# Patient Record
Sex: Male | Born: 1955
Health system: Southern US, Community
[De-identification: ages and names within clinical notes are randomized; demographics above are authoritative.]

## PROBLEM LIST (undated history)

## (undated) DIAGNOSIS — M109 Gout, unspecified: Secondary | ICD-10-CM

## (undated) DIAGNOSIS — I10 Essential (primary) hypertension: Secondary | ICD-10-CM

## (undated) DIAGNOSIS — L299 Pruritus, unspecified: Secondary | ICD-10-CM

## (undated) DIAGNOSIS — E119 Type 2 diabetes mellitus without complications: Secondary | ICD-10-CM

## (undated) DIAGNOSIS — L219 Seborrheic dermatitis, unspecified: Secondary | ICD-10-CM

## (undated) DIAGNOSIS — M7711 Lateral epicondylitis, right elbow: Secondary | ICD-10-CM

## (undated) HISTORY — PX: CATARACT EXTRACTION, BILATERAL: SHX1313

## (undated) HISTORY — PX: HIP SURGERY: SHX245

---

## 1998-06-26 ENCOUNTER — Ambulatory Visit (HOSPITAL_BASED_OUTPATIENT_CLINIC_OR_DEPARTMENT_OTHER): Admission: RE | Admit: 1998-06-26 | Discharge: 1998-06-26 | Payer: Self-pay | Admitting: Orthopedic Surgery

## 2003-06-12 ENCOUNTER — Ambulatory Visit (HOSPITAL_BASED_OUTPATIENT_CLINIC_OR_DEPARTMENT_OTHER): Admission: RE | Admit: 2003-06-12 | Discharge: 2003-06-12 | Payer: Self-pay | Admitting: Otolaryngology

## 2003-11-04 ENCOUNTER — Ambulatory Visit (HOSPITAL_COMMUNITY): Admission: RE | Admit: 2003-11-04 | Discharge: 2003-11-04 | Payer: Self-pay | Admitting: Neurology

## 2004-01-11 ENCOUNTER — Other Ambulatory Visit: Payer: Self-pay

## 2004-11-22 ENCOUNTER — Ambulatory Visit (HOSPITAL_COMMUNITY): Admission: RE | Admit: 2004-11-22 | Discharge: 2004-11-22 | Payer: Self-pay | Admitting: Cardiology

## 2004-12-27 ENCOUNTER — Inpatient Hospital Stay (HOSPITAL_COMMUNITY): Admission: RE | Admit: 2004-12-27 | Discharge: 2004-12-31 | Payer: Self-pay | Admitting: Orthopedic Surgery

## 2005-09-22 ENCOUNTER — Ambulatory Visit (HOSPITAL_COMMUNITY): Admission: RE | Admit: 2005-09-22 | Discharge: 2005-09-22 | Payer: Self-pay | Admitting: Gastroenterology

## 2009-06-13 ENCOUNTER — Emergency Department: Payer: Self-pay | Admitting: Internal Medicine

## 2009-06-24 ENCOUNTER — Emergency Department: Payer: Self-pay | Admitting: Emergency Medicine

## 2011-01-08 ENCOUNTER — Encounter: Payer: Self-pay | Admitting: Cardiology

## 2011-11-11 ENCOUNTER — Other Ambulatory Visit: Payer: Self-pay | Admitting: Cardiology

## 2011-11-27 ENCOUNTER — Other Ambulatory Visit: Payer: Self-pay | Admitting: Cardiology

## 2011-12-07 ENCOUNTER — Ambulatory Visit: Payer: Self-pay | Admitting: Family Medicine

## 2011-12-18 ENCOUNTER — Other Ambulatory Visit: Payer: Self-pay | Admitting: Cardiology

## 2012-03-27 ENCOUNTER — Ambulatory Visit: Payer: Self-pay

## 2012-05-15 ENCOUNTER — Other Ambulatory Visit: Payer: Self-pay | Admitting: Cardiology

## 2012-07-16 ENCOUNTER — Other Ambulatory Visit: Payer: Self-pay | Admitting: Cardiology

## 2013-11-19 DIAGNOSIS — J449 Chronic obstructive pulmonary disease, unspecified: Secondary | ICD-10-CM | POA: Diagnosis not present

## 2013-11-19 DIAGNOSIS — I10 Essential (primary) hypertension: Secondary | ICD-10-CM | POA: Diagnosis not present

## 2013-11-19 DIAGNOSIS — G4733 Obstructive sleep apnea (adult) (pediatric): Secondary | ICD-10-CM | POA: Diagnosis not present

## 2013-11-19 DIAGNOSIS — R5381 Other malaise: Secondary | ICD-10-CM | POA: Diagnosis not present

## 2013-11-19 DIAGNOSIS — R0609 Other forms of dyspnea: Secondary | ICD-10-CM | POA: Diagnosis not present

## 2013-11-19 DIAGNOSIS — E789 Disorder of lipoprotein metabolism, unspecified: Secondary | ICD-10-CM | POA: Diagnosis not present

## 2013-11-19 DIAGNOSIS — R7309 Other abnormal glucose: Secondary | ICD-10-CM | POA: Diagnosis not present

## 2013-11-21 DIAGNOSIS — I10 Essential (primary) hypertension: Secondary | ICD-10-CM | POA: Diagnosis not present

## 2013-11-21 DIAGNOSIS — E7800 Pure hypercholesterolemia, unspecified: Secondary | ICD-10-CM | POA: Diagnosis not present

## 2013-11-21 DIAGNOSIS — R7309 Other abnormal glucose: Secondary | ICD-10-CM | POA: Diagnosis not present

## 2013-12-04 DIAGNOSIS — R0602 Shortness of breath: Secondary | ICD-10-CM | POA: Diagnosis not present

## 2013-12-06 ENCOUNTER — Ambulatory Visit: Payer: Self-pay | Admitting: Specialist

## 2013-12-06 DIAGNOSIS — G4710 Hypersomnia, unspecified: Secondary | ICD-10-CM | POA: Diagnosis not present

## 2013-12-06 DIAGNOSIS — G4733 Obstructive sleep apnea (adult) (pediatric): Secondary | ICD-10-CM | POA: Diagnosis not present

## 2013-12-06 DIAGNOSIS — G4761 Periodic limb movement disorder: Secondary | ICD-10-CM | POA: Diagnosis not present

## 2013-12-06 DIAGNOSIS — E669 Obesity, unspecified: Secondary | ICD-10-CM | POA: Diagnosis not present

## 2013-12-06 DIAGNOSIS — G478 Other sleep disorders: Secondary | ICD-10-CM | POA: Diagnosis not present

## 2013-12-17 DIAGNOSIS — G4733 Obstructive sleep apnea (adult) (pediatric): Secondary | ICD-10-CM | POA: Diagnosis not present

## 2013-12-17 DIAGNOSIS — E669 Obesity, unspecified: Secondary | ICD-10-CM | POA: Diagnosis not present

## 2013-12-17 DIAGNOSIS — R0609 Other forms of dyspnea: Secondary | ICD-10-CM | POA: Diagnosis not present

## 2013-12-17 DIAGNOSIS — J449 Chronic obstructive pulmonary disease, unspecified: Secondary | ICD-10-CM | POA: Diagnosis not present

## 2013-12-24 ENCOUNTER — Encounter: Payer: Self-pay | Admitting: Specialist

## 2013-12-24 DIAGNOSIS — Z5189 Encounter for other specified aftercare: Secondary | ICD-10-CM | POA: Diagnosis not present

## 2013-12-24 DIAGNOSIS — J449 Chronic obstructive pulmonary disease, unspecified: Secondary | ICD-10-CM | POA: Diagnosis not present

## 2013-12-27 ENCOUNTER — Ambulatory Visit: Payer: Self-pay | Admitting: Specialist

## 2013-12-27 DIAGNOSIS — G4733 Obstructive sleep apnea (adult) (pediatric): Secondary | ICD-10-CM | POA: Diagnosis not present

## 2013-12-27 DIAGNOSIS — G4761 Periodic limb movement disorder: Secondary | ICD-10-CM | POA: Diagnosis not present

## 2013-12-27 DIAGNOSIS — G4710 Hypersomnia, unspecified: Secondary | ICD-10-CM | POA: Diagnosis not present

## 2013-12-27 DIAGNOSIS — G4730 Sleep apnea, unspecified: Secondary | ICD-10-CM | POA: Diagnosis not present

## 2013-12-30 DIAGNOSIS — J449 Chronic obstructive pulmonary disease, unspecified: Secondary | ICD-10-CM | POA: Diagnosis not present

## 2013-12-30 DIAGNOSIS — Z5189 Encounter for other specified aftercare: Secondary | ICD-10-CM | POA: Diagnosis not present

## 2014-01-01 DIAGNOSIS — Z5189 Encounter for other specified aftercare: Secondary | ICD-10-CM | POA: Diagnosis not present

## 2014-01-01 DIAGNOSIS — J449 Chronic obstructive pulmonary disease, unspecified: Secondary | ICD-10-CM | POA: Diagnosis not present

## 2014-01-03 DIAGNOSIS — J449 Chronic obstructive pulmonary disease, unspecified: Secondary | ICD-10-CM | POA: Diagnosis not present

## 2014-01-03 DIAGNOSIS — Z5189 Encounter for other specified aftercare: Secondary | ICD-10-CM | POA: Diagnosis not present

## 2014-01-06 DIAGNOSIS — J449 Chronic obstructive pulmonary disease, unspecified: Secondary | ICD-10-CM | POA: Diagnosis not present

## 2014-01-06 DIAGNOSIS — Z5189 Encounter for other specified aftercare: Secondary | ICD-10-CM | POA: Diagnosis not present

## 2014-01-08 DIAGNOSIS — J449 Chronic obstructive pulmonary disease, unspecified: Secondary | ICD-10-CM | POA: Diagnosis not present

## 2014-01-08 DIAGNOSIS — Z5189 Encounter for other specified aftercare: Secondary | ICD-10-CM | POA: Diagnosis not present

## 2014-01-10 DIAGNOSIS — Z5189 Encounter for other specified aftercare: Secondary | ICD-10-CM | POA: Diagnosis not present

## 2014-01-10 DIAGNOSIS — J449 Chronic obstructive pulmonary disease, unspecified: Secondary | ICD-10-CM | POA: Diagnosis not present

## 2014-01-13 DIAGNOSIS — Z5189 Encounter for other specified aftercare: Secondary | ICD-10-CM | POA: Diagnosis not present

## 2014-01-13 DIAGNOSIS — J449 Chronic obstructive pulmonary disease, unspecified: Secondary | ICD-10-CM | POA: Diagnosis not present

## 2014-01-15 DIAGNOSIS — J449 Chronic obstructive pulmonary disease, unspecified: Secondary | ICD-10-CM | POA: Diagnosis not present

## 2014-01-15 DIAGNOSIS — Z5189 Encounter for other specified aftercare: Secondary | ICD-10-CM | POA: Diagnosis not present

## 2014-01-17 DIAGNOSIS — Z5189 Encounter for other specified aftercare: Secondary | ICD-10-CM | POA: Diagnosis not present

## 2014-01-17 DIAGNOSIS — J449 Chronic obstructive pulmonary disease, unspecified: Secondary | ICD-10-CM | POA: Diagnosis not present

## 2014-01-19 ENCOUNTER — Encounter: Payer: Self-pay | Admitting: Specialist

## 2014-01-19 DIAGNOSIS — Z5189 Encounter for other specified aftercare: Secondary | ICD-10-CM | POA: Diagnosis not present

## 2014-01-19 DIAGNOSIS — J449 Chronic obstructive pulmonary disease, unspecified: Secondary | ICD-10-CM | POA: Diagnosis not present

## 2014-02-16 ENCOUNTER — Encounter: Payer: Self-pay | Admitting: Specialist

## 2014-02-16 DIAGNOSIS — J449 Chronic obstructive pulmonary disease, unspecified: Secondary | ICD-10-CM | POA: Diagnosis not present

## 2014-02-16 DIAGNOSIS — Z5189 Encounter for other specified aftercare: Secondary | ICD-10-CM | POA: Diagnosis not present

## 2014-02-17 DIAGNOSIS — Z5189 Encounter for other specified aftercare: Secondary | ICD-10-CM | POA: Diagnosis not present

## 2014-02-17 DIAGNOSIS — J449 Chronic obstructive pulmonary disease, unspecified: Secondary | ICD-10-CM | POA: Diagnosis not present

## 2014-02-19 DIAGNOSIS — Z5189 Encounter for other specified aftercare: Secondary | ICD-10-CM | POA: Diagnosis not present

## 2014-02-19 DIAGNOSIS — J449 Chronic obstructive pulmonary disease, unspecified: Secondary | ICD-10-CM | POA: Diagnosis not present

## 2014-02-21 DIAGNOSIS — J449 Chronic obstructive pulmonary disease, unspecified: Secondary | ICD-10-CM | POA: Diagnosis not present

## 2014-02-21 DIAGNOSIS — Z5189 Encounter for other specified aftercare: Secondary | ICD-10-CM | POA: Diagnosis not present

## 2014-02-25 DIAGNOSIS — J45902 Unspecified asthma with status asthmaticus: Secondary | ICD-10-CM | POA: Diagnosis not present

## 2014-02-25 DIAGNOSIS — J449 Chronic obstructive pulmonary disease, unspecified: Secondary | ICD-10-CM | POA: Diagnosis not present

## 2014-02-25 DIAGNOSIS — J310 Chronic rhinitis: Secondary | ICD-10-CM | POA: Diagnosis not present

## 2014-02-25 DIAGNOSIS — E669 Obesity, unspecified: Secondary | ICD-10-CM | POA: Diagnosis not present

## 2014-02-25 DIAGNOSIS — G4733 Obstructive sleep apnea (adult) (pediatric): Secondary | ICD-10-CM | POA: Diagnosis not present

## 2014-02-26 DIAGNOSIS — J449 Chronic obstructive pulmonary disease, unspecified: Secondary | ICD-10-CM | POA: Diagnosis not present

## 2014-02-26 DIAGNOSIS — Z5189 Encounter for other specified aftercare: Secondary | ICD-10-CM | POA: Diagnosis not present

## 2014-02-27 DIAGNOSIS — G4733 Obstructive sleep apnea (adult) (pediatric): Secondary | ICD-10-CM | POA: Diagnosis not present

## 2014-02-27 DIAGNOSIS — I10 Essential (primary) hypertension: Secondary | ICD-10-CM | POA: Diagnosis not present

## 2014-02-27 DIAGNOSIS — J449 Chronic obstructive pulmonary disease, unspecified: Secondary | ICD-10-CM | POA: Diagnosis not present

## 2014-02-27 DIAGNOSIS — E7800 Pure hypercholesterolemia, unspecified: Secondary | ICD-10-CM | POA: Diagnosis not present

## 2014-02-28 DIAGNOSIS — Z5189 Encounter for other specified aftercare: Secondary | ICD-10-CM | POA: Diagnosis not present

## 2014-02-28 DIAGNOSIS — J449 Chronic obstructive pulmonary disease, unspecified: Secondary | ICD-10-CM | POA: Diagnosis not present

## 2014-03-03 DIAGNOSIS — J449 Chronic obstructive pulmonary disease, unspecified: Secondary | ICD-10-CM | POA: Diagnosis not present

## 2014-03-03 DIAGNOSIS — Z5189 Encounter for other specified aftercare: Secondary | ICD-10-CM | POA: Diagnosis not present

## 2014-03-05 DIAGNOSIS — J449 Chronic obstructive pulmonary disease, unspecified: Secondary | ICD-10-CM | POA: Diagnosis not present

## 2014-03-05 DIAGNOSIS — Z5189 Encounter for other specified aftercare: Secondary | ICD-10-CM | POA: Diagnosis not present

## 2014-03-10 DIAGNOSIS — J449 Chronic obstructive pulmonary disease, unspecified: Secondary | ICD-10-CM | POA: Diagnosis not present

## 2014-03-10 DIAGNOSIS — Z5189 Encounter for other specified aftercare: Secondary | ICD-10-CM | POA: Diagnosis not present

## 2014-03-12 DIAGNOSIS — Z5189 Encounter for other specified aftercare: Secondary | ICD-10-CM | POA: Diagnosis not present

## 2014-03-12 DIAGNOSIS — J449 Chronic obstructive pulmonary disease, unspecified: Secondary | ICD-10-CM | POA: Diagnosis not present

## 2014-03-14 DIAGNOSIS — Z5189 Encounter for other specified aftercare: Secondary | ICD-10-CM | POA: Diagnosis not present

## 2014-03-14 DIAGNOSIS — J449 Chronic obstructive pulmonary disease, unspecified: Secondary | ICD-10-CM | POA: Diagnosis not present

## 2014-03-19 ENCOUNTER — Encounter: Payer: Self-pay | Admitting: Specialist

## 2014-03-19 DIAGNOSIS — Z5189 Encounter for other specified aftercare: Secondary | ICD-10-CM | POA: Diagnosis not present

## 2014-03-19 DIAGNOSIS — J449 Chronic obstructive pulmonary disease, unspecified: Secondary | ICD-10-CM | POA: Diagnosis not present

## 2014-03-21 DIAGNOSIS — J449 Chronic obstructive pulmonary disease, unspecified: Secondary | ICD-10-CM | POA: Diagnosis not present

## 2014-03-21 DIAGNOSIS — Z5189 Encounter for other specified aftercare: Secondary | ICD-10-CM | POA: Diagnosis not present

## 2014-03-24 DIAGNOSIS — Z5189 Encounter for other specified aftercare: Secondary | ICD-10-CM | POA: Diagnosis not present

## 2014-03-24 DIAGNOSIS — J449 Chronic obstructive pulmonary disease, unspecified: Secondary | ICD-10-CM | POA: Diagnosis not present

## 2014-03-28 DIAGNOSIS — J449 Chronic obstructive pulmonary disease, unspecified: Secondary | ICD-10-CM | POA: Diagnosis not present

## 2014-03-28 DIAGNOSIS — Z5189 Encounter for other specified aftercare: Secondary | ICD-10-CM | POA: Diagnosis not present

## 2014-03-31 DIAGNOSIS — J449 Chronic obstructive pulmonary disease, unspecified: Secondary | ICD-10-CM | POA: Diagnosis not present

## 2014-03-31 DIAGNOSIS — Z5189 Encounter for other specified aftercare: Secondary | ICD-10-CM | POA: Diagnosis not present

## 2014-04-02 DIAGNOSIS — Z5189 Encounter for other specified aftercare: Secondary | ICD-10-CM | POA: Diagnosis not present

## 2014-04-02 DIAGNOSIS — J449 Chronic obstructive pulmonary disease, unspecified: Secondary | ICD-10-CM | POA: Diagnosis not present

## 2014-04-04 DIAGNOSIS — J449 Chronic obstructive pulmonary disease, unspecified: Secondary | ICD-10-CM | POA: Diagnosis not present

## 2014-04-04 DIAGNOSIS — Z5189 Encounter for other specified aftercare: Secondary | ICD-10-CM | POA: Diagnosis not present

## 2014-04-07 DIAGNOSIS — J449 Chronic obstructive pulmonary disease, unspecified: Secondary | ICD-10-CM | POA: Diagnosis not present

## 2014-04-07 DIAGNOSIS — Z5189 Encounter for other specified aftercare: Secondary | ICD-10-CM | POA: Diagnosis not present

## 2014-04-23 DIAGNOSIS — J309 Allergic rhinitis, unspecified: Secondary | ICD-10-CM | POA: Diagnosis not present

## 2014-04-23 DIAGNOSIS — S40019A Contusion of unspecified shoulder, initial encounter: Secondary | ICD-10-CM | POA: Diagnosis not present

## 2014-04-23 DIAGNOSIS — J441 Chronic obstructive pulmonary disease with (acute) exacerbation: Secondary | ICD-10-CM | POA: Diagnosis not present

## 2014-05-07 ENCOUNTER — Ambulatory Visit: Payer: Self-pay | Admitting: Family Medicine

## 2014-05-07 DIAGNOSIS — R0602 Shortness of breath: Secondary | ICD-10-CM | POA: Diagnosis not present

## 2014-05-07 DIAGNOSIS — J449 Chronic obstructive pulmonary disease, unspecified: Secondary | ICD-10-CM | POA: Diagnosis not present

## 2014-05-26 DIAGNOSIS — J309 Allergic rhinitis, unspecified: Secondary | ICD-10-CM | POA: Diagnosis not present

## 2014-05-26 DIAGNOSIS — S40019A Contusion of unspecified shoulder, initial encounter: Secondary | ICD-10-CM | POA: Diagnosis not present

## 2014-05-26 DIAGNOSIS — L259 Unspecified contact dermatitis, unspecified cause: Secondary | ICD-10-CM | POA: Diagnosis not present

## 2014-05-29 DIAGNOSIS — J449 Chronic obstructive pulmonary disease, unspecified: Secondary | ICD-10-CM | POA: Diagnosis not present

## 2014-05-29 DIAGNOSIS — I10 Essential (primary) hypertension: Secondary | ICD-10-CM | POA: Diagnosis not present

## 2014-05-29 DIAGNOSIS — G4733 Obstructive sleep apnea (adult) (pediatric): Secondary | ICD-10-CM | POA: Diagnosis not present

## 2014-05-29 DIAGNOSIS — E785 Hyperlipidemia, unspecified: Secondary | ICD-10-CM | POA: Diagnosis not present

## 2014-05-29 DIAGNOSIS — R7309 Other abnormal glucose: Secondary | ICD-10-CM | POA: Diagnosis not present

## 2014-05-29 DIAGNOSIS — E669 Obesity, unspecified: Secondary | ICD-10-CM | POA: Diagnosis not present

## 2014-07-02 DIAGNOSIS — E669 Obesity, unspecified: Secondary | ICD-10-CM | POA: Diagnosis not present

## 2014-07-02 DIAGNOSIS — R0609 Other forms of dyspnea: Secondary | ICD-10-CM | POA: Diagnosis not present

## 2014-07-02 DIAGNOSIS — G4733 Obstructive sleep apnea (adult) (pediatric): Secondary | ICD-10-CM | POA: Diagnosis not present

## 2014-07-02 DIAGNOSIS — J449 Chronic obstructive pulmonary disease, unspecified: Secondary | ICD-10-CM | POA: Diagnosis not present

## 2014-07-02 DIAGNOSIS — R0681 Apnea, not elsewhere classified: Secondary | ICD-10-CM | POA: Insufficient documentation

## 2014-07-02 DIAGNOSIS — R0989 Other specified symptoms and signs involving the circulatory and respiratory systems: Secondary | ICD-10-CM | POA: Diagnosis not present

## 2014-08-01 DIAGNOSIS — I10 Essential (primary) hypertension: Secondary | ICD-10-CM | POA: Diagnosis not present

## 2014-08-01 DIAGNOSIS — R9431 Abnormal electrocardiogram [ECG] [EKG]: Secondary | ICD-10-CM | POA: Diagnosis not present

## 2014-08-01 DIAGNOSIS — R002 Palpitations: Secondary | ICD-10-CM | POA: Diagnosis not present

## 2014-08-01 DIAGNOSIS — J449 Chronic obstructive pulmonary disease, unspecified: Secondary | ICD-10-CM | POA: Diagnosis not present

## 2014-08-01 DIAGNOSIS — E785 Hyperlipidemia, unspecified: Secondary | ICD-10-CM | POA: Diagnosis not present

## 2014-08-01 DIAGNOSIS — R7309 Other abnormal glucose: Secondary | ICD-10-CM | POA: Diagnosis not present

## 2014-08-01 DIAGNOSIS — G4733 Obstructive sleep apnea (adult) (pediatric): Secondary | ICD-10-CM | POA: Diagnosis not present

## 2014-09-08 DIAGNOSIS — R002 Palpitations: Secondary | ICD-10-CM | POA: Diagnosis not present

## 2014-09-09 ENCOUNTER — Ambulatory Visit: Payer: Self-pay | Admitting: Family Medicine

## 2014-09-09 DIAGNOSIS — L290 Pruritus ani: Secondary | ICD-10-CM | POA: Diagnosis not present

## 2014-09-09 DIAGNOSIS — M79609 Pain in unspecified limb: Secondary | ICD-10-CM | POA: Diagnosis not present

## 2014-09-09 DIAGNOSIS — S40019A Contusion of unspecified shoulder, initial encounter: Secondary | ICD-10-CM | POA: Diagnosis not present

## 2014-09-09 DIAGNOSIS — Z23 Encounter for immunization: Secondary | ICD-10-CM | POA: Diagnosis not present

## 2014-09-09 DIAGNOSIS — M948X9 Other specified disorders of cartilage, unspecified sites: Secondary | ICD-10-CM | POA: Diagnosis not present

## 2014-09-09 LAB — COMPREHENSIVE METABOLIC PANEL
Albumin: 3.7 g/dL (ref 3.4–5.0)
Alkaline Phosphatase: 66 U/L
Anion Gap: 6 — ABNORMAL LOW (ref 7–16)
BUN: 18 mg/dL (ref 7–18)
Bilirubin,Total: 0.9 mg/dL (ref 0.2–1.0)
Calcium, Total: 8.9 mg/dL (ref 8.5–10.1)
Chloride: 107 mmol/L (ref 98–107)
Co2: 28 mmol/L (ref 21–32)
Creatinine: 1.09 mg/dL (ref 0.60–1.30)
EGFR (African American): >60
EGFR (Non-African Amer.): >60
Glucose: 106 mg/dL — ABNORMAL HIGH (ref 65–99)
Osmolality: 284 (ref 275–301)
Potassium: 3.7 mmol/L (ref 3.5–5.1)
SGOT(AST): 30 U/L (ref 15–37)
SGPT (ALT): 51 U/L
Sodium: 141 mmol/L (ref 136–145)
Total Protein: 7.5 g/dL (ref 6.4–8.2)

## 2014-09-09 LAB — URIC ACID: Uric Acid: 6.8 mg/dL (ref 3.5–7.2)

## 2014-09-12 DIAGNOSIS — R7309 Other abnormal glucose: Secondary | ICD-10-CM | POA: Diagnosis not present

## 2014-09-12 DIAGNOSIS — I498 Other specified cardiac arrhythmias: Secondary | ICD-10-CM | POA: Diagnosis not present

## 2014-09-12 DIAGNOSIS — R791 Abnormal coagulation profile: Secondary | ICD-10-CM | POA: Diagnosis not present

## 2014-09-12 DIAGNOSIS — I10 Essential (primary) hypertension: Secondary | ICD-10-CM | POA: Diagnosis not present

## 2014-09-12 DIAGNOSIS — I252 Old myocardial infarction: Secondary | ICD-10-CM | POA: Diagnosis not present

## 2014-09-26 DIAGNOSIS — G4733 Obstructive sleep apnea (adult) (pediatric): Secondary | ICD-10-CM | POA: Diagnosis not present

## 2014-09-26 DIAGNOSIS — I10 Essential (primary) hypertension: Secondary | ICD-10-CM | POA: Diagnosis not present

## 2014-09-26 DIAGNOSIS — M1990 Unspecified osteoarthritis, unspecified site: Secondary | ICD-10-CM | POA: Diagnosis not present

## 2014-09-26 DIAGNOSIS — J449 Chronic obstructive pulmonary disease, unspecified: Secondary | ICD-10-CM | POA: Diagnosis not present

## 2014-09-26 DIAGNOSIS — E785 Hyperlipidemia, unspecified: Secondary | ICD-10-CM | POA: Diagnosis not present

## 2014-12-30 DIAGNOSIS — J449 Chronic obstructive pulmonary disease, unspecified: Secondary | ICD-10-CM | POA: Diagnosis not present

## 2014-12-30 DIAGNOSIS — G4733 Obstructive sleep apnea (adult) (pediatric): Secondary | ICD-10-CM | POA: Diagnosis not present

## 2014-12-30 DIAGNOSIS — M1990 Unspecified osteoarthritis, unspecified site: Secondary | ICD-10-CM | POA: Diagnosis not present

## 2014-12-30 DIAGNOSIS — I10 Essential (primary) hypertension: Secondary | ICD-10-CM | POA: Diagnosis not present

## 2014-12-30 DIAGNOSIS — E785 Hyperlipidemia, unspecified: Secondary | ICD-10-CM | POA: Diagnosis not present

## 2015-01-07 DIAGNOSIS — E780 Pure hypercholesterolemia: Secondary | ICD-10-CM | POA: Diagnosis not present

## 2015-01-07 DIAGNOSIS — N419 Inflammatory disease of prostate, unspecified: Secondary | ICD-10-CM | POA: Diagnosis not present

## 2015-01-07 DIAGNOSIS — R7309 Other abnormal glucose: Secondary | ICD-10-CM | POA: Diagnosis not present

## 2015-01-26 DIAGNOSIS — M7711 Lateral epicondylitis, right elbow: Secondary | ICD-10-CM | POA: Diagnosis not present

## 2015-01-26 DIAGNOSIS — E119 Type 2 diabetes mellitus without complications: Secondary | ICD-10-CM | POA: Diagnosis not present

## 2015-03-09 DIAGNOSIS — M7711 Lateral epicondylitis, right elbow: Secondary | ICD-10-CM | POA: Diagnosis not present

## 2015-03-09 DIAGNOSIS — H109 Unspecified conjunctivitis: Secondary | ICD-10-CM | POA: Diagnosis not present

## 2015-03-23 DIAGNOSIS — H10023 Other mucopurulent conjunctivitis, bilateral: Secondary | ICD-10-CM | POA: Diagnosis not present

## 2015-03-30 DIAGNOSIS — H10023 Other mucopurulent conjunctivitis, bilateral: Secondary | ICD-10-CM | POA: Diagnosis not present

## 2015-04-01 DIAGNOSIS — E785 Hyperlipidemia, unspecified: Secondary | ICD-10-CM | POA: Diagnosis not present

## 2015-04-01 DIAGNOSIS — E119 Type 2 diabetes mellitus without complications: Secondary | ICD-10-CM | POA: Diagnosis not present

## 2015-04-01 DIAGNOSIS — G4733 Obstructive sleep apnea (adult) (pediatric): Secondary | ICD-10-CM | POA: Diagnosis not present

## 2015-04-01 DIAGNOSIS — I10 Essential (primary) hypertension: Secondary | ICD-10-CM | POA: Diagnosis not present

## 2015-04-01 DIAGNOSIS — J449 Chronic obstructive pulmonary disease, unspecified: Secondary | ICD-10-CM | POA: Diagnosis not present

## 2015-04-17 DIAGNOSIS — E119 Type 2 diabetes mellitus without complications: Secondary | ICD-10-CM | POA: Diagnosis not present

## 2015-04-20 DIAGNOSIS — Z0000 Encounter for general adult medical examination without abnormal findings: Secondary | ICD-10-CM | POA: Diagnosis not present

## 2015-04-20 DIAGNOSIS — Z23 Encounter for immunization: Secondary | ICD-10-CM | POA: Diagnosis not present

## 2015-04-20 DIAGNOSIS — M7711 Lateral epicondylitis, right elbow: Secondary | ICD-10-CM | POA: Diagnosis not present

## 2015-04-20 DIAGNOSIS — E119 Type 2 diabetes mellitus without complications: Secondary | ICD-10-CM | POA: Diagnosis not present

## 2015-04-20 DIAGNOSIS — Z1389 Encounter for screening for other disorder: Secondary | ICD-10-CM | POA: Diagnosis not present

## 2015-04-28 DIAGNOSIS — E119 Type 2 diabetes mellitus without complications: Secondary | ICD-10-CM | POA: Diagnosis not present

## 2015-05-13 DIAGNOSIS — E119 Type 2 diabetes mellitus without complications: Secondary | ICD-10-CM | POA: Diagnosis not present

## 2015-05-19 DIAGNOSIS — L218 Other seborrheic dermatitis: Secondary | ICD-10-CM | POA: Diagnosis not present

## 2015-05-19 DIAGNOSIS — L918 Other hypertrophic disorders of the skin: Secondary | ICD-10-CM | POA: Diagnosis not present

## 2015-05-27 ENCOUNTER — Other Ambulatory Visit: Payer: Self-pay | Admitting: Family Medicine

## 2015-05-28 ENCOUNTER — Telehealth: Payer: Self-pay | Admitting: Family Medicine

## 2015-05-28 DIAGNOSIS — B078 Other viral warts: Secondary | ICD-10-CM | POA: Diagnosis not present

## 2015-06-09 DIAGNOSIS — H2513 Age-related nuclear cataract, bilateral: Secondary | ICD-10-CM | POA: Diagnosis not present

## 2015-07-01 DIAGNOSIS — I10 Essential (primary) hypertension: Secondary | ICD-10-CM | POA: Diagnosis not present

## 2015-07-01 DIAGNOSIS — E785 Hyperlipidemia, unspecified: Secondary | ICD-10-CM | POA: Diagnosis not present

## 2015-07-01 DIAGNOSIS — G4733 Obstructive sleep apnea (adult) (pediatric): Secondary | ICD-10-CM | POA: Diagnosis not present

## 2015-07-01 DIAGNOSIS — R0789 Other chest pain: Secondary | ICD-10-CM | POA: Diagnosis not present

## 2015-07-01 DIAGNOSIS — R002 Palpitations: Secondary | ICD-10-CM | POA: Diagnosis not present

## 2015-07-01 DIAGNOSIS — M1990 Unspecified osteoarthritis, unspecified site: Secondary | ICD-10-CM | POA: Diagnosis not present

## 2015-07-01 DIAGNOSIS — E119 Type 2 diabetes mellitus without complications: Secondary | ICD-10-CM | POA: Diagnosis not present

## 2015-07-01 DIAGNOSIS — J449 Chronic obstructive pulmonary disease, unspecified: Secondary | ICD-10-CM | POA: Diagnosis not present

## 2015-07-07 ENCOUNTER — Other Ambulatory Visit: Payer: Self-pay | Admitting: Family Medicine

## 2015-07-08 DIAGNOSIS — E119 Type 2 diabetes mellitus without complications: Secondary | ICD-10-CM | POA: Diagnosis not present

## 2015-07-20 ENCOUNTER — Encounter: Payer: Self-pay | Admitting: *Deleted

## 2015-07-21 ENCOUNTER — Ambulatory Visit: Payer: Medicare Other | Admitting: Anesthesiology

## 2015-07-21 ENCOUNTER — Ambulatory Visit
Admission: RE | Admit: 2015-07-21 | Discharge: 2015-07-21 | Disposition: A | Discharge status: Home / Self Care | Payer: Medicare Other | Source: Ambulatory Visit | Attending: Gastroenterology | Admitting: Gastroenterology

## 2015-07-21 ENCOUNTER — Encounter
Admission: RE | Disposition: A | Discharge status: Home / Self Care | Payer: Self-pay | Source: Ambulatory Visit | Attending: Gastroenterology

## 2015-07-21 DIAGNOSIS — Z1211 Encounter for screening for malignant neoplasm of colon: Secondary | ICD-10-CM | POA: Diagnosis not present

## 2015-07-21 DIAGNOSIS — I10 Essential (primary) hypertension: Secondary | ICD-10-CM | POA: Insufficient documentation

## 2015-07-21 DIAGNOSIS — D125 Benign neoplasm of sigmoid colon: Secondary | ICD-10-CM | POA: Insufficient documentation

## 2015-07-21 DIAGNOSIS — J449 Chronic obstructive pulmonary disease, unspecified: Secondary | ICD-10-CM | POA: Diagnosis not present

## 2015-07-21 DIAGNOSIS — K635 Polyp of colon: Secondary | ICD-10-CM | POA: Diagnosis not present

## 2015-07-21 DIAGNOSIS — D123 Benign neoplasm of transverse colon: Secondary | ICD-10-CM | POA: Insufficient documentation

## 2015-07-21 DIAGNOSIS — D122 Benign neoplasm of ascending colon: Secondary | ICD-10-CM | POA: Diagnosis not present

## 2015-07-21 DIAGNOSIS — E119 Type 2 diabetes mellitus without complications: Secondary | ICD-10-CM | POA: Insufficient documentation

## 2015-07-21 HISTORY — DX: Lateral epicondylitis, right elbow: M77.11

## 2015-07-21 HISTORY — DX: Gout, unspecified: M10.9

## 2015-07-21 HISTORY — DX: Essential (primary) hypertension: I10

## 2015-07-21 HISTORY — DX: Seborrheic dermatitis, unspecified: L21.9

## 2015-07-21 HISTORY — DX: Pruritus, unspecified: L29.9

## 2015-07-21 HISTORY — DX: Type 2 diabetes mellitus without complications: E11.9

## 2015-07-21 HISTORY — PX: COLONOSCOPY WITH PROPOFOL: SHX5780

## 2015-07-21 LAB — GLUCOSE, CAPILLARY: Glucose-Capillary: 117 mg/dL — ABNORMAL HIGH (ref 65–99)

## 2015-07-21 SURGERY — COLONOSCOPY WITH PROPOFOL
Anesthesia: General

## 2015-07-21 MED ORDER — SODIUM CHLORIDE 0.9 % IV SOLN
INTRAVENOUS | Status: DC
  Administered 2015-07-21: 1000 mL via INTRAVENOUS

## 2015-07-21 MED ORDER — PROPOFOL INFUSION 10 MG/ML OPTIME
INTRAVENOUS | Status: DC | PRN
  Administered 2015-07-21: 160 ug/kg/min via INTRAVENOUS

## 2015-07-21 MED ORDER — MIDAZOLAM HCL 2 MG/2ML IJ SOLN
INTRAMUSCULAR | Status: DC | PRN
  Administered 2015-07-21: 2 mg via INTRAVENOUS

## 2015-07-21 MED ORDER — LIDOCAINE HCL (CARDIAC) 20 MG/ML IV SOLN
INTRAVENOUS | Status: DC | PRN
  Administered 2015-07-21: 80 mg via INTRAVENOUS

## 2015-07-21 NOTE — H&P (Signed)
  [facility]  [address], [place], [place] [address] Phone: [phone] Fax : [phone]  Primary Care Physician:  Lelon Huh, MD Primary Gastroenterologist:  Dr. Allen Norris  Pre-Procedure History & Physical: HPI:  Jeremy Hendrix is a 59 y.o. male is here for a screening colonoscopy.   Past Medical History  Diagnosis Date  . Gout   . Lateral epicondylitis of right elbow   . Toe pain   . Pruritus   . Diabetes mellitus without complication   . Fatigue   . Anxiety   . Hypertension   . Hyperlipidemia   . COPD (chronic obstructive pulmonary disease)   . Seborrheic dermatitis     No past surgical history on file.  Prior to Admission medications   Medication Sig Start Date End Date Taking? Authorizing Provider  albuterol (PROVENTIL HFA;VENTOLIN HFA) 108 (90 BASE) MCG/ACT inhaler Inhale into the lungs every 6 (six) hours as needed for wheezing or shortness of breath.   Yes Historical Provider, MD  allopurinol (ZYLOPRIM) 100 MG tablet TAKE ONE TABLET BY MOUTH ONCE DAILY 07/07/15  Yes Birdie Sons, MD  aspirin 325 MG tablet Take 325 mg by mouth daily.   Yes Historical Provider, MD  atorvastatin (LIPITOR) 20 MG tablet Take 20 mg by mouth daily.   Yes Historical Provider, MD  Fluticasone-Salmeterol (ADVAIR) 250-50 MCG/DOSE AEPB Inhale 1 puff into the lungs 2 (two) times daily.   Yes Historical Provider, MD  indomethacin (INDOCIN) 50 MG capsule Take 50 mg by mouth daily as needed.   Yes Historical Provider, MD  losartan-hydrochlorothiazide (HYZAAR) 100-25 MG per tablet Take 1 tablet by mouth daily.   Yes Historical Provider, MD  metFORMIN (GLUCOPHAGE) 500 MG tablet Take by mouth every morning.   Yes Historical Provider, MD  tiotropium (SPIRIVA) 18 MCG inhalation capsule Place 18 mcg into inhaler and inhale daily.   Yes Historical Provider, MD    Allergies as of 05/08/2015  . (Not on File)    No family history on file.  History   Social History  .  Marital Status: Single    Spouse Name: N/A  . Number of Children: N/A  . Years of Education: N/A   Occupational History  . Not on file.   Social History Main Topics  . Smoking status: Not on file  . Smokeless tobacco: Not on file  . Alcohol Use: Not on file  . Drug Use: Not on file  . Sexual Activity: Not on file   Other Topics Concern  . Not on file   Social History Narrative  . No narrative on file    Review of Systems: See HPI, otherwise negative ROS  Physical Exam: BP 175/108 mmHg  Pulse 110  Temp(Src) 98.9 F (37.2 C) (Tympanic)  Resp 22  Ht 5\' 8"  (1.727 m)  Wt 380 lb (172.367 kg)  BMI 57.79 kg/m2  SpO2 97% General:   Alert,  pleasant and cooperative in NAD Head:  Normocephalic and atraumatic. Neck:  Supple; no masses or thyromegaly. Lungs:  Clear throughout to auscultation.    Heart:  Regular rate and rhythm. Abdomen:  Soft, nontender and nondistended. Normal bowel sounds, without guarding, and without rebound.   Neurologic:  Alert and  oriented x4;  grossly normal neurologically.  Impression/Plan: Jeremy Hendrix is now here to undergo a screening colonoscopy.  Risks, benefits, and alternatives regarding colonoscopy have been reviewed with the patient.  Questions have been answered.  All parties agreeable.

## 2015-07-21 NOTE — Anesthesia Postprocedure Evaluation (Signed)
  Anesthesia Post-op Note  Patient: Jeremy Hendrix  Procedure(s) Performed: Procedure(s): COLONOSCOPY WITH PROPOFOL (N/A)  Anesthesia type:General  Patient location: PACU  Post pain: Pain level controlled  Post assessment: Post-op Vital signs reviewed, Patient's Cardiovascular Status Stable, Respiratory Function Stable, Patent Airway and No signs of Nausea or vomiting  Post vital signs: Reviewed and stable  Last Vitals:  Filed Vitals:   07/21/15 1055  BP:   Pulse: 88  Temp:   Resp: 16    Level of consciousness: awake, alert  and patient cooperative  Complications: No apparent anesthesia complications

## 2015-07-21 NOTE — Anesthesia Preprocedure Evaluation (Signed)
Anesthesia Evaluation   Patient awake    Reviewed: Allergy & Precautions, NPO status , Patient's Chart, lab work & pertinent test results  Airway Mallampati: III       Dental no notable dental hx.    Pulmonary sleep apnea and Continuous Positive Airway Pressure Ventilation , COPD COPD inhaler,  + rhonchi   + decreased breath sounds      Cardiovascular hypertension, Pt. on medications Normal cardiovascular exam    Neuro/Psych Anxiety negative neurological ROS     GI/Hepatic negative GI ROS, Neg liver ROS,   Endo/Other  diabetes, Type 2, Oral Hypoglycemic Agents  Renal/GU      Musculoskeletal   Abdominal Normal abdominal exam  (+)   Peds negative pediatric ROS (+)  Hematology negative hematology ROS (+)   Anesthesia Other Findings   Reproductive/Obstetrics                             Anesthesia Physical Anesthesia Plan  ASA: III  Anesthesia Plan: General   Post-op Pain Management:    Induction: Intravenous  Airway Management Planned: Nasal Cannula  Additional Equipment:   Intra-op Plan:   Post-operative Plan:   Informed Consent: I have reviewed the patients History and Physical, chart, labs and discussed the procedure including the risks, benefits and alternatives for the proposed anesthesia with the patient or authorized representative who has indicated his/her understanding and acceptance.     Plan Discussed with: CRNA  Anesthesia Plan Comments:         Anesthesia Quick Evaluation

## 2015-07-21 NOTE — Transfer of Care (Signed)
Immediate Anesthesia Transfer of Care Note  Patient: Jeremy Hendrix  Procedure(s) Performed: Procedure(s): COLONOSCOPY WITH PROPOFOL (N/A)  Patient Location: PACU and Endoscopy Unit  Anesthesia Type:General  Level of Consciousness: sedated  Airway & Oxygen Therapy: Patient Spontanous Breathing and Patient connected to nasal cannula oxygen  Post-op Assessment: Report given to RN and Post -op Vital signs reviewed and stable  Post vital signs: Reviewed and stable  Last Vitals:  Filed Vitals:   07/21/15 1049  BP: 88/53  Pulse: 85  Temp: 37.9 C  Resp: 18    Complications: No apparent anesthesia complications and Patient re-intubated

## 2015-07-21 NOTE — Op Note (Signed)
[facility] Patient Name: Jeremy Hendrix Procedure Date: 07/21/2015 10:23 AM MRN: [id number] Account #: [id number] Date of Birth: 17-Feb-1956 Admit Type: Outpatient Age: 59 Room: [facility] ENDO ROOM 4 Gender: Male Note Status: Finalized Procedure:         Colonoscopy Indications:       Screening for colorectal malignant neoplasm Providers:         Lucilla Lame, MD Referring MD:      Kirstie Peri. Caryn Section, MD (Referring MD) Medicines:         Propofol per Anesthesia Complications:     No immediate complications. Procedure:         Pre-Anesthesia Assessment:                    - Prior to the procedure, a History and Physical was                     performed, and patient medications and allergies were                     reviewed. The patient's tolerance of previous anesthesia                     was also reviewed. The risks and benefits of the procedure                     and the sedation options and risks were discussed with the                     patient. All questions were answered, and informed consent                     was obtained. Prior Anticoagulants: The patient has taken                     no previous anticoagulant or antiplatelet agents. ASA                     Grade Assessment: III - A patient with severe systemic                     disease. After reviewing the risks and benefits, the                     patient was deemed in satisfactory condition to undergo                     the procedure.                    After obtaining informed consent, the colonoscope was                     passed under direct vision. Throughout the procedure, the                     patient's blood pressure, pulse, and oxygen saturations                     were monitored continuously. The Colonoscope was                     introduced through the anus and advanced to the the cecum,  identified by appendiceal orifice and ileocecal valve. The                    colonoscopy was performed without difficulty. The patient                     tolerated the procedure well. The quality of the bowel                     preparation was excellent. Findings:      The perianal and digital rectal examinations were normal.      Three sessile polyps were found in the ascending colon. The polyps were       4 to 5 mm in size. These polyps were removed with a cold biopsy forceps.       Resection and retrieval were complete.      A 6 mm polyp was found in the sigmoid colon. The polyp was sessile. The       polyp was removed with a cold snare. Resection and retrieval were       complete. Impression:        - Three 4 to 5 mm polyps in the ascending colon. Resected                     and retrieved.                    - One 6 mm polyp in the sigmoid colon. Resected and                     retrieved. Recommendation:    - Await pathology results.                    - Repeat colonoscopy in 5 years if polyp adenoma and 10                     years if hyperplastic Procedure Code(s): --- Professional ---                    [phone], Colonoscopy, flexible; with removal of tumor(s),                     polyp(s), or other lesion(s) by snare technique                    45380, 56, Colonoscopy, flexible; with biopsy, single or                     multiple Diagnosis Code(s): --- Professional ---                    Z12.11, Encounter for screening for malignant neoplasm of                     colon                    D12.2, Benign neoplasm of ascending colon                    D12.5, Benign neoplasm of sigmoid colon CPT copyright 2014 American Medical Association. All rights reserved. The codes documented in this report are preliminary and upon coder review may  be revised to meet current compliance requirements. Lucilla Lame, MD 07/21/2015 10:43:39 AM This report has been signed electronically. Number of  Addenda: 0 Note Initiated On: 07/21/2015 10:23 AM Scope  Withdrawal Time: 0 hours 9 minutes 39 seconds  Total Procedure Duration: 0 hours 11 minutes 37 seconds       [facility]

## 2015-07-22 LAB — SURGICAL PATHOLOGY

## 2015-07-23 ENCOUNTER — Encounter: Payer: Self-pay | Admitting: Gastroenterology

## 2015-07-31 ENCOUNTER — Telehealth: Payer: Self-pay

## 2015-07-31 NOTE — Telephone Encounter (Signed)
Patient called at this time and states that he had a Colonoscopy done on 07/21/15 and has not heard results. Please call.

## 2015-08-03 NOTE — Telephone Encounter (Signed)
Pt notified of results

## 2015-08-18 DIAGNOSIS — L218 Other seborrheic dermatitis: Secondary | ICD-10-CM | POA: Diagnosis not present

## 2015-09-10 DIAGNOSIS — M109 Gout, unspecified: Secondary | ICD-10-CM | POA: Insufficient documentation

## 2015-09-10 DIAGNOSIS — E7849 Other hyperlipidemia: Secondary | ICD-10-CM | POA: Insufficient documentation

## 2015-09-10 DIAGNOSIS — L304 Erythema intertrigo: Secondary | ICD-10-CM | POA: Insufficient documentation

## 2015-09-10 DIAGNOSIS — M7710 Lateral epicondylitis, unspecified elbow: Secondary | ICD-10-CM | POA: Insufficient documentation

## 2015-09-10 DIAGNOSIS — J449 Chronic obstructive pulmonary disease, unspecified: Secondary | ICD-10-CM | POA: Insufficient documentation

## 2015-09-10 DIAGNOSIS — F419 Anxiety disorder, unspecified: Secondary | ICD-10-CM | POA: Insufficient documentation

## 2015-09-10 DIAGNOSIS — I10 Essential (primary) hypertension: Secondary | ICD-10-CM | POA: Insufficient documentation

## 2015-09-10 DIAGNOSIS — E119 Type 2 diabetes mellitus without complications: Secondary | ICD-10-CM | POA: Insufficient documentation

## 2015-09-11 ENCOUNTER — Ambulatory Visit
Admission: RE | Admit: 2015-09-11 | Discharge: 2015-09-11 | Disposition: A | Discharge status: Home / Self Care | Payer: Medicare Other | Source: Ambulatory Visit | Attending: Family Medicine | Admitting: Family Medicine

## 2015-09-11 ENCOUNTER — Encounter: Payer: Self-pay | Admitting: Family Medicine

## 2015-09-11 ENCOUNTER — Ambulatory Visit (INDEPENDENT_AMBULATORY_CARE_PROVIDER_SITE_OTHER): Payer: Medicare Other | Admitting: Family Medicine

## 2015-09-11 ENCOUNTER — Other Ambulatory Visit: Payer: Self-pay

## 2015-09-11 VITALS — BP 140/92 | HR 100 | Temp 98.0°F | Resp 20 | Wt 385.0 lb

## 2015-09-11 DIAGNOSIS — M79671 Pain in right foot: Secondary | ICD-10-CM | POA: Insufficient documentation

## 2015-09-11 DIAGNOSIS — M79604 Pain in right leg: Secondary | ICD-10-CM | POA: Diagnosis not present

## 2015-09-11 DIAGNOSIS — S9031XA Contusion of right foot, initial encounter: Secondary | ICD-10-CM | POA: Diagnosis not present

## 2015-09-11 MED ORDER — CEPHALEXIN 500 MG PO CAPS: 500.0000 mg | ORAL_CAPSULE | Freq: Four times a day (QID) | ORAL | Status: DC

## 2015-09-11 NOTE — Progress Notes (Signed)
Patient ID: Jeremy Hendrix, male   DOB: [birth year], 59 y.o.   MRN: [id number] Name: Jeremy Hendrix   MRN: [id number]    DOB: Nov 11, 1956   Date:09/11/2015       Progress Note  Subjective  Chief Complaint  Chief Complaint  Patient presents with  . Foot Pain    X 1 week, right foot    Foot Pain This is a new problem. The current episode started in the past 7 days. The problem occurs constantly. Associated symptoms include joint swelling.  Initial pain and swelling started after helping a friend push a car. Felt a sharp pain immediately (as if something had fallen on the right foot). Having significant bruising and swelling of the forefoot and around to the heel.  Past Surgical History  Procedure Laterality Date  . Colonoscopy with propofol N/A 07/21/2015    Procedure: COLONOSCOPY WITH PROPOFOL;  Surgeon: Lucilla Lame, MD;  Location: [facility] ENDOSCOPY;  Service: Endoscopy;  Laterality: N/A;  . Hip surgery      Past Medical History  Diagnosis Date  . Gout   . Lateral epicondylitis of right elbow   . Toe pain   . Pruritus   . Diabetes mellitus without complication   . Fatigue   . Anxiety   . Hypertension   . Hyperlipidemia   . COPD (chronic obstructive pulmonary disease)   . Seborrheic dermatitis     Social History  Substance Use Topics  . Smoking status: Former Smoker -- 1.00 packs/day for 18 years    Types: Cigarettes  . Smokeless tobacco: Never Used     Comment: QUIT IN 2000  . Alcohol Use: 0.0 oz/week    0 Standard drinks or equivalent per week     Comment: OCCASIONALLY    Current outpatient prescriptions:  .  albuterol (PROVENTIL HFA;VENTOLIN HFA) 108 (90 BASE) MCG/ACT inhaler, Inhale into the lungs every 6 (six) hours as needed for wheezing or shortness of breath., Disp: , Rfl:  .  allopurinol (ZYLOPRIM) 100 MG tablet, TAKE ONE TABLET BY MOUTH ONCE DAILY, Disp: 30 tablet, Rfl: 12 .  aspirin 325 MG tablet, Take 325 mg by mouth daily., Disp: , Rfl:  .  ASPIRIN  BUFFERED PO, Take by mouth., Disp: , Rfl:  .  atorvastatin (LIPITOR) 20 MG tablet, Take 20 mg by mouth daily., Disp: , Rfl:  .  Cholecalciferol (VITAMIN D) 2000 UNITS CAPS, Take by mouth., Disp: , Rfl:  .  diltiazem (DILACOR XR) 180 MG 24 hr capsule, Take by mouth., Disp: , Rfl:  .  enalapril (VASOTEC) 20 MG tablet, Take by mouth., Disp: , Rfl:  .  Fluticasone-Salmeterol (ADVAIR) 250-50 MCG/DOSE AEPB, Inhale 1 puff into the lungs 2 (two) times daily., Disp: , Rfl:  .  glucose blood test strip, , Disp: , Rfl:  .  indomethacin (INDOCIN) 50 MG capsule, Take 50 mg by mouth daily as needed., Disp: , Rfl:  .  ketoconazole (NIZORAL) 2 % shampoo, , Disp: , Rfl:  .  ketorolac (ACULAR) 0.4 % SOLN, Apply to eye., Disp: , Rfl:  .  LECITHIN PO, Take by mouth., Disp: , Rfl:  .  losartan-hydrochlorothiazide (HYZAAR) 100-25 MG per tablet, Take 1 tablet by mouth daily., Disp: , Rfl:  .  metFORMIN (GLUCOPHAGE) 500 MG tablet, Take by mouth every morning., Disp: , Rfl:  .  MULTIPLE VITAMINS-MINERALS PO, Take by mouth., Disp: , Rfl:  .  TAZTIA XT 240 MG 24 hr capsule, , Disp: , Rfl:  .  tiotropium (SPIRIVA) 18 MCG inhalation capsule, Place 18 mcg into inhaler and inhale daily., Disp: , Rfl:   Allergies  Allergen Reactions  . Percocet [Oxycodone-Acetaminophen]    Review of Systems  Constitutional: Negative.   HENT: Negative.   Eyes: Negative.   Respiratory: Negative.   Cardiovascular: Negative.   Gastrointestinal: Negative.   Genitourinary: Negative.   Musculoskeletal: Positive for joint pain and joint swelling.  Skin: Negative.   Neurological: Negative.   Endo/Heme/Allergies: Negative.   Psychiatric/Behavioral: Negative.    Objective  Filed Vitals:   09/11/15 1335  BP: 140/92  Pulse: 100  Temp: 98 F (36.7 C)  TempSrc: Oral  Resp: 20  Weight: 385 lb (174.635 kg)  SpO2: 94%   Physical Exam  Constitutional: He is oriented to person, place, and time and well-developed, well-nourished, and in  no distress.  Obesity.  HENT:  Head: Normocephalic and atraumatic.  Eyes: Conjunctivae are normal.  Neck: Neck supple.  Musculoskeletal: He exhibits tenderness.  Ecchymosis from lateral right heel to toes with 2+ edema. Pulses are symmetric and present. Erythema over the right forefoot at the 4th metatarsal. Slightly sore to palpate in the red area. No open wounds.  Neurological: He is alert and oriented to person, place, and time.   Assessment & Plan  1. Pain of right lower extremity Onset over the past week after helping a friend push a car. Suspect contusion or fracture but concerned about infection with area of erythema. Will treat with antibiotic and get x-ray evaluation. Still taking his Indomethacin 50 mg qd to prevent gout. Elevate as often as possible. Recheck pending x-ray report. - DG Foot Complete Right - cephALEXin (KEFLEX) 500 MG capsule; Take 1 capsule (500 mg total) by mouth 4 (four) times daily.  Dispense: 28 capsule; Refill: 0

## 2015-09-15 ENCOUNTER — Telehealth: Payer: Self-pay | Admitting: Family Medicine

## 2015-09-15 NOTE — Telephone Encounter (Signed)
Pt states he had an x-ray on Friday and is requesting the results.  CB#[phone]/MW

## 2015-09-15 NOTE — Telephone Encounter (Signed)
LMTCB

## 2015-09-15 NOTE — Telephone Encounter (Signed)
Patient was notified of results. Expressed understanding.  

## 2015-09-15 NOTE — Telephone Encounter (Signed)
-----   Message from Margo Common, [place] sent at 09/11/2015  5:28 PM EDT ----- Diffuse degenerative changes in foot. No sign of fracture. Proceed with antibiotic called to pharmacy today. Someone should recheck foot in a week to 10 days for total clearing and no sign of persistent infection. Sooner if any worsening.

## 2015-09-15 NOTE — Telephone Encounter (Signed)
Patient notified of results. Expressed understanding.

## 2015-09-16 ENCOUNTER — Telehealth: Payer: Self-pay | Admitting: Family Medicine

## 2015-09-16 NOTE — Telephone Encounter (Signed)
Pt stated he was returning a message left by Dr. Maralyn Sago nurse. Thanks TNP

## 2015-09-18 ENCOUNTER — Other Ambulatory Visit: Payer: Self-pay | Admitting: Family Medicine

## 2015-09-21 ENCOUNTER — Encounter: Payer: Self-pay | Admitting: Family Medicine

## 2015-09-21 ENCOUNTER — Ambulatory Visit (INDEPENDENT_AMBULATORY_CARE_PROVIDER_SITE_OTHER): Payer: Medicare Other | Admitting: Family Medicine

## 2015-09-21 VITALS — BP 110/80 | HR 80 | Temp 98.9°F | Resp 16 | Ht 69.0 in

## 2015-09-21 DIAGNOSIS — M79604 Pain in right leg: Secondary | ICD-10-CM | POA: Diagnosis not present

## 2015-09-21 DIAGNOSIS — Z23 Encounter for immunization: Secondary | ICD-10-CM | POA: Diagnosis not present

## 2015-09-21 MED ORDER — CEPHALEXIN 500 MG PO CAPS: 500.0000 mg | ORAL_CAPSULE | Freq: Four times a day (QID) | ORAL | Status: DC

## 2015-09-21 NOTE — Progress Notes (Signed)
Patient: Jeremy Hendrix Male    DOB: [birth year]   59 y.o.   MRN: [id number] Visit Date: 09/21/2015  Today's Provider: Lelon Huh, MD   Chief Complaint  Patient presents with  . Follow-up    recheck right foot  . Foot Pain    right   Subjective:    HPI  Follow-up for right foot pain from 09/11/2015 which occurred when he was pushing a car and somehow strained his foot. He saw Vernie Murders, PA.Treatment  Included normal DG Foot Complete Right and prescribed cephalexin due to erythema on the dorsal aspect of foot due to potential for infection.  He states pain is resolving, but area is still red, tender and swollen. He is able to walk and bare weight. He states the area of inflammation is about the same as it was on the 23rd.    Allergies  Allergen Reactions  . Percocet [Oxycodone-Acetaminophen]    Previous Medications   ALBUTEROL (PROVENTIL HFA;VENTOLIN HFA) 108 (90 BASE) MCG/ACT INHALER    Inhale into the lungs every 6 (six) hours as needed for wheezing or shortness of breath.   ALLOPURINOL (ZYLOPRIM) 100 MG TABLET    TAKE ONE TABLET BY MOUTH ONCE DAILY   ASPIRIN 325 MG TABLET    Take 325 mg by mouth daily.   ASPIRIN BUFFERED PO    Take by mouth.   ATORVASTATIN (LIPITOR) 20 MG TABLET    Take 20 mg by mouth daily.   CEPHALEXIN (KEFLEX) 500 MG CAPSULE    Take 1 capsule (500 mg total) by mouth 4 (four) times daily.   CHOLECALCIFEROL (VITAMIN D) 2000 UNITS CAPS    Take by mouth.   DILTIAZEM (DILACOR XR) 180 MG 24 HR CAPSULE    Take by mouth.   ENALAPRIL (VASOTEC) 20 MG TABLET    Take by mouth.   FLUTICASONE-SALMETEROL (ADVAIR) 250-50 MCG/DOSE AEPB    Inhale 1 puff into the lungs 2 (two) times daily.   GLUCOSE BLOOD TEST STRIP       INDOMETHACIN (INDOCIN) 50 MG CAPSULE    Take 50 mg by mouth daily as needed.   KETOCONAZOLE (NIZORAL) 2 % SHAMPOO       KETOROLAC (ACULAR) 0.4 % SOLN    Apply to eye.   LECITHIN PO    Take by mouth.   LOSARTAN-HYDROCHLOROTHIAZIDE  (HYZAAR) 100-25 MG PER TABLET    Take 1 tablet by mouth daily.   METFORMIN (GLUCOPHAGE) 500 MG TABLET    Take by mouth every morning.   MULTIPLE VITAMINS-MINERALS PO    Take by mouth.   SPIRIVA HANDIHALER 18 MCG INHALATION CAPSULE    INHALE ONE DOSE BY MOUTH ONCE DAILY   TAZTIA XT 240 MG 24 HR CAPSULE        Review of Systems  Constitutional: Negative for fever, chills and appetite change.  Respiratory: Negative for chest tightness, shortness of breath and wheezing.   Cardiovascular: Negative for chest pain and palpitations.  Gastrointestinal: Negative for nausea, vomiting and abdominal pain.    Social History  Substance Use Topics  . Smoking status: Former Smoker -- 1.00 packs/day for 18 years    Types: Cigarettes  . Smokeless tobacco: Never Used     Comment: QUIT IN 2000  . Alcohol Use: 0.0 oz/week    0 Standard drinks or equivalent per week     Comment: OCCASIONALLY   Objective:   BP 110/80 mmHg  Pulse 80  Temp(Src) 98.9 F (  37.2 C) (Oral)  Resp 16  Ht 5\' 9"  (1.753 m)  Physical Exam  Foot: about 2x3cm patch of tender erythema, 1+ edema and slightly tender to the touch on the dorsal aspect of right forefoot.     Assessment & Plan:     1. Pain of right lower extremity Still mildly inflamed since initially injury a few weeks ago. Expect continued improved. Is to call if not resolved within 10 days.  - cephALEXin (KEFLEX) 500 MG capsule; Take 1 capsule (500 mg total) by mouth 4 (four) times daily.  Dispense: 40 capsule; Refill: 0  2. Need for influenza vaccination  - Flu Vaccine QUAD 36+ mos IM       Lelon Huh, MD  [facility]

## 2015-09-21 NOTE — Telephone Encounter (Signed)
Patient seen in office today for f/u.

## 2015-09-24 ENCOUNTER — Telehealth: Payer: Self-pay | Admitting: Family Medicine

## 2015-10-12 ENCOUNTER — Other Ambulatory Visit: Payer: Self-pay | Admitting: Family Medicine

## 2015-10-14 DIAGNOSIS — E669 Obesity, unspecified: Secondary | ICD-10-CM | POA: Diagnosis not present

## 2015-10-14 DIAGNOSIS — E785 Hyperlipidemia, unspecified: Secondary | ICD-10-CM | POA: Diagnosis not present

## 2015-10-14 DIAGNOSIS — M1990 Unspecified osteoarthritis, unspecified site: Secondary | ICD-10-CM | POA: Diagnosis not present

## 2015-10-14 DIAGNOSIS — I10 Essential (primary) hypertension: Secondary | ICD-10-CM | POA: Diagnosis not present

## 2015-10-14 DIAGNOSIS — J449 Chronic obstructive pulmonary disease, unspecified: Secondary | ICD-10-CM | POA: Diagnosis not present

## 2015-10-14 DIAGNOSIS — E119 Type 2 diabetes mellitus without complications: Secondary | ICD-10-CM | POA: Diagnosis not present

## 2015-10-16 ENCOUNTER — Other Ambulatory Visit: Payer: Self-pay

## 2015-10-16 ENCOUNTER — Ambulatory Visit (INDEPENDENT_AMBULATORY_CARE_PROVIDER_SITE_OTHER): Payer: Medicare Other | Admitting: Family Medicine

## 2015-10-16 ENCOUNTER — Other Ambulatory Visit: Payer: Self-pay | Admitting: Family Medicine

## 2015-10-16 ENCOUNTER — Encounter: Payer: Self-pay | Admitting: Family Medicine

## 2015-10-16 VITALS — BP 122/78 | Temp 97.6°F | Resp 20 | Wt 369.6 lb

## 2015-10-16 DIAGNOSIS — R300 Dysuria: Secondary | ICD-10-CM

## 2015-10-16 DIAGNOSIS — R350 Frequency of micturition: Secondary | ICD-10-CM | POA: Diagnosis not present

## 2015-10-16 DIAGNOSIS — R319 Hematuria, unspecified: Secondary | ICD-10-CM

## 2015-10-16 MED ORDER — CIPROFLOXACIN HCL 500 MG PO TABS: 500.0000 mg | ORAL_TABLET | Freq: Two times a day (BID) | ORAL | Status: DC

## 2015-10-16 NOTE — Patient Instructions (Signed)

## 2015-10-16 NOTE — Progress Notes (Signed)
Patient ID: Jeremy Hendrix, male   DOB: [birth year], 59 y.o.   MRN: [id number] Name: Jeremy Hendrix   MRN: [id number]    DOB: 19-Oct-1956   Date:10/16/2015       Progress Note  Subjective  Chief Complaint  Chief Complaint  Patient presents with  . Urinary Frequency    Urinary Frequency  This is a new problem. The current episode started in the past 7 days. The problem occurs every urination. Associated symptoms include chills, frequency, hematuria, nausea and sweats.   Past Surgical History  Procedure Laterality Date  . Colonoscopy with propofol N/A 07/21/2015    Procedure: COLONOSCOPY WITH PROPOFOL;  Surgeon: Lucilla Lame, MD;  Location: [facility] ENDOSCOPY;  Service: Endoscopy;  Laterality: N/A;  . Hip surgery     Family History  Problem Relation Age of Onset  . Myasthenia gravis Mother   . Heart disease Father   . Healthy Sister     Past Medical History  Diagnosis Date  . Gout   . Lateral epicondylitis of right elbow   . Toe pain   . Pruritus   . Diabetes mellitus without complication ([place])   . Fatigue   . Anxiety   . Hypertension   . Hyperlipidemia   . COPD (chronic obstructive pulmonary disease) ([place])   . Seborrheic dermatitis     Social History  Substance Use Topics  . Smoking status: Former Smoker -- 1.00 packs/day for 18 years    Types: Cigarettes  . Smokeless tobacco: Never Used     Comment: QUIT IN 2000  . Alcohol Use: 0.0 oz/week    0 Standard drinks or equivalent per week     Comment: OCCASIONALLY     Current outpatient prescriptions:  .  albuterol (PROVENTIL HFA;VENTOLIN HFA) 108 (90 BASE) MCG/ACT inhaler, Inhale into the lungs every 6 (six) hours as needed for wheezing or shortness of breath., Disp: , Rfl:  .  allopurinol (ZYLOPRIM) 100 MG tablet, TAKE ONE TABLET BY MOUTH ONCE DAILY, Disp: 30 tablet, Rfl: 12 .  aspirin 325 MG tablet, Take 325 mg by mouth daily., Disp: , Rfl:  .  atorvastatin (LIPITOR) 20 MG tablet, Take 20 mg by mouth daily., Disp: ,  Rfl:  .  Cholecalciferol (VITAMIN D) 2000 UNITS CAPS, Take by mouth., Disp: , Rfl:  .  diltiazem (DILACOR XR) 180 MG 24 hr capsule, Take by mouth., Disp: , Rfl:  .  enalapril (VASOTEC) 20 MG tablet, Take by mouth., Disp: , Rfl:  .  Fluticasone-Salmeterol (ADVAIR) 250-50 MCG/DOSE AEPB, Inhale 1 puff into the lungs 2 (two) times daily., Disp: , Rfl:  .  glucose blood test strip, , Disp: , Rfl:  .  indomethacin (INDOCIN) 50 MG capsule, Take 50 mg by mouth daily as needed., Disp: , Rfl:  .  ketoconazole (NIZORAL) 2 % shampoo, , Disp: , Rfl:  .  ketorolac (ACULAR) 0.4 % SOLN, Apply to eye., Disp: , Rfl:  .  LECITHIN PO, Take by mouth., Disp: , Rfl:  .  losartan-hydrochlorothiazide (HYZAAR) 100-25 MG per tablet, Take 1 tablet by mouth daily., Disp: , Rfl:  .  metFORMIN (GLUCOPHAGE) 500 MG tablet, Take by mouth every morning., Disp: , Rfl:  .  MULTIPLE VITAMINS-MINERALS PO, Take by mouth., Disp: , Rfl:  .  neomycin-polymyxin-hydrocortisone (CORTISPORIN) otic solution, INSTILL FOUR DROPS INTO AFFECTED EAR EVERY 6 HOURS AS NEEDED, Disp: 10 mL, Rfl: 0 .  SPIRIVA HANDIHALER 18 MCG inhalation capsule, INHALE ONE DOSE BY MOUTH ONCE DAILY,  Disp: 30 capsule, Rfl: 12 .  TAZTIA XT 240 MG 24 hr capsule, , Disp: , Rfl:   Allergies  Allergen Reactions  . Percocet [Oxycodone-Acetaminophen]    Review of Systems  Constitutional: Positive for chills.  HENT: Negative.   Eyes: Negative.   Respiratory: Negative.   Cardiovascular: Negative.   Gastrointestinal: Positive for nausea.  Genitourinary: Positive for frequency and hematuria.  Musculoskeletal: Negative.   Skin: Negative.   Neurological: Negative.   Endo/Heme/Allergies: Negative.   Psychiatric/Behavioral: Negative.    Objective  Filed Vitals:   10/16/15 1555  BP: 122/78  Temp: 97.6 F (36.4 C)  TempSrc: Oral  Resp: 20  Weight: 369 lb 9.6 oz (167.649 kg)  SpO2: 91%   Physical Exam  Constitutional: He is oriented to person, place, and time.   Severe obesity, dyspnea with any exertion and sweating.  HENT:  Head: Normocephalic.  Eyes: Conjunctivae are normal.  Neck: Neck supple.  Cardiovascular: Tachycardia present.   Pulmonary/Chest: He is in respiratory distress.  Dyspnea on exertion worse with recent urinary tract symptoms.  Abdominal: Soft. Bowel sounds are normal. There is no tenderness. There is no rebound and no guarding.  Marked morbid obesity.  Neurological: He is alert and oriented to person, place, and time.   Assessment & Plan  1. Frequent urination Onset on 10-13-15 with sweats and chills. Urinalysis showed pyuria and hematuria without crystals. Many bacteria. Will get urine culture and start antibiotic. Increase fluid intake and recheck pending report. - POCT urinalysis dipstick - Urine culture  2. Dysuria Started with frequency and hematuria on 10-13-15. Some back ache but no abdominal discomfort. Start antibiotic while awaiting culture report. - ciprofloxacin (CIPRO) 500 MG tablet; Take 1 tablet (500 mg total) by mouth 2 (two) times daily.  Dispense: 20 tablet; Refill: 0  3. Hematuria Gross hematuria at onset. No nausea or vomiting. No CVA tenderness to percussion. Increase water intake and may use AZO-Standard if needed for dysuria. Recheck pending lab reports. May need CT scan or urology referral, if persistent.

## 2015-10-18 LAB — URINE CULTURE

## 2015-10-19 ENCOUNTER — Telehealth: Payer: Self-pay

## 2015-10-19 NOTE — Telephone Encounter (Signed)
Patient advised as directed below. Patient verbalized understanding and agrees with treatment plan. 

## 2015-10-19 NOTE — Telephone Encounter (Signed)
LMTCB

## 2015-10-19 NOTE — Telephone Encounter (Signed)
-----   Message from [place], [place] sent at 10/19/2015  1:03 AM EDT ----- Culture grew a mixture of urogenital bacteria that should respond well to the antibiotic given. If no better after taking all the antibiotic, should recheck urinalysis and possibly re-culture for cure.

## 2016-01-06 DIAGNOSIS — E119 Type 2 diabetes mellitus without complications: Secondary | ICD-10-CM | POA: Diagnosis not present

## 2016-01-06 DIAGNOSIS — E785 Hyperlipidemia, unspecified: Secondary | ICD-10-CM | POA: Diagnosis not present

## 2016-01-06 DIAGNOSIS — I10 Essential (primary) hypertension: Secondary | ICD-10-CM | POA: Diagnosis not present

## 2016-01-13 DIAGNOSIS — M1990 Unspecified osteoarthritis, unspecified site: Secondary | ICD-10-CM | POA: Diagnosis not present

## 2016-01-13 DIAGNOSIS — E119 Type 2 diabetes mellitus without complications: Secondary | ICD-10-CM | POA: Diagnosis not present

## 2016-01-13 DIAGNOSIS — I10 Essential (primary) hypertension: Secondary | ICD-10-CM | POA: Diagnosis not present

## 2016-01-13 DIAGNOSIS — E785 Hyperlipidemia, unspecified: Secondary | ICD-10-CM | POA: Diagnosis not present

## 2016-01-13 DIAGNOSIS — E669 Obesity, unspecified: Secondary | ICD-10-CM | POA: Diagnosis not present

## 2016-01-13 DIAGNOSIS — J449 Chronic obstructive pulmonary disease, unspecified: Secondary | ICD-10-CM | POA: Diagnosis not present

## 2016-01-13 DIAGNOSIS — G4733 Obstructive sleep apnea (adult) (pediatric): Secondary | ICD-10-CM | POA: Diagnosis not present

## 2016-04-22 ENCOUNTER — Other Ambulatory Visit: Payer: Self-pay | Admitting: *Deleted

## 2016-04-22 MED ORDER — LOSARTAN POTASSIUM-HCTZ 100-25 MG PO TABS: 1.0000 | ORAL_TABLET | Freq: Every day | ORAL | Status: DC

## 2016-04-27 DIAGNOSIS — J449 Chronic obstructive pulmonary disease, unspecified: Secondary | ICD-10-CM | POA: Diagnosis not present

## 2016-04-27 DIAGNOSIS — I10 Essential (primary) hypertension: Secondary | ICD-10-CM | POA: Diagnosis not present

## 2016-04-27 DIAGNOSIS — E119 Type 2 diabetes mellitus without complications: Secondary | ICD-10-CM | POA: Diagnosis not present

## 2016-04-27 DIAGNOSIS — E785 Hyperlipidemia, unspecified: Secondary | ICD-10-CM | POA: Diagnosis not present

## 2016-04-27 DIAGNOSIS — G4733 Obstructive sleep apnea (adult) (pediatric): Secondary | ICD-10-CM | POA: Diagnosis not present

## 2016-05-26 DIAGNOSIS — R69 Illness, unspecified: Secondary | ICD-10-CM | POA: Diagnosis not present

## 2016-06-06 ENCOUNTER — Other Ambulatory Visit: Payer: Self-pay | Admitting: Family Medicine

## 2016-06-06 MED ORDER — ALBUTEROL SULFATE HFA 108 (90 BASE) MCG/ACT IN AERS: 2.0000 | INHALATION_SPRAY | Freq: Four times a day (QID) | RESPIRATORY_TRACT | Status: DC | PRN

## 2016-06-06 NOTE — Telephone Encounter (Signed)
Pt needs new Rx for Proair inhaler  [place]  Pt's call back is [phone]  [employer]

## 2016-06-28 DIAGNOSIS — I10 Essential (primary) hypertension: Secondary | ICD-10-CM | POA: Diagnosis not present

## 2016-07-15 DIAGNOSIS — I10 Essential (primary) hypertension: Secondary | ICD-10-CM | POA: Diagnosis not present

## 2016-07-16 ENCOUNTER — Other Ambulatory Visit: Payer: Self-pay | Admitting: Family Medicine

## 2016-07-18 DIAGNOSIS — E113393 Type 2 diabetes mellitus with moderate nonproliferative diabetic retinopathy without macular edema, bilateral: Secondary | ICD-10-CM | POA: Diagnosis not present

## 2016-07-22 NOTE — Telephone Encounter (Signed)
error 

## 2016-07-26 NOTE — Telephone Encounter (Signed)
error 

## 2016-07-27 DIAGNOSIS — G4733 Obstructive sleep apnea (adult) (pediatric): Secondary | ICD-10-CM | POA: Diagnosis not present

## 2016-07-27 DIAGNOSIS — I10 Essential (primary) hypertension: Secondary | ICD-10-CM | POA: Diagnosis not present

## 2016-07-27 DIAGNOSIS — J449 Chronic obstructive pulmonary disease, unspecified: Secondary | ICD-10-CM | POA: Diagnosis not present

## 2016-07-27 DIAGNOSIS — E785 Hyperlipidemia, unspecified: Secondary | ICD-10-CM | POA: Diagnosis not present

## 2016-07-27 DIAGNOSIS — E119 Type 2 diabetes mellitus without complications: Secondary | ICD-10-CM | POA: Diagnosis not present

## 2016-08-12 ENCOUNTER — Other Ambulatory Visit: Payer: Self-pay | Admitting: Family Medicine

## 2016-08-15 DIAGNOSIS — G4733 Obstructive sleep apnea (adult) (pediatric): Secondary | ICD-10-CM | POA: Diagnosis not present

## 2016-08-15 DIAGNOSIS — J449 Chronic obstructive pulmonary disease, unspecified: Secondary | ICD-10-CM | POA: Diagnosis not present

## 2016-08-15 DIAGNOSIS — R0602 Shortness of breath: Secondary | ICD-10-CM | POA: Diagnosis not present

## 2016-08-26 DIAGNOSIS — I10 Essential (primary) hypertension: Secondary | ICD-10-CM | POA: Diagnosis not present

## 2016-08-30 ENCOUNTER — Ambulatory Visit (INDEPENDENT_AMBULATORY_CARE_PROVIDER_SITE_OTHER): Payer: Medicare HMO | Admitting: Family Medicine

## 2016-08-30 ENCOUNTER — Encounter: Payer: Self-pay | Admitting: Family Medicine

## 2016-08-30 VITALS — BP 110/82 | HR 96 | Temp 98.8°F | Resp 18 | Wt 390.2 lb

## 2016-08-30 DIAGNOSIS — Z23 Encounter for immunization: Secondary | ICD-10-CM

## 2016-08-30 DIAGNOSIS — H6991 Unspecified Eustachian tube disorder, right ear: Secondary | ICD-10-CM

## 2016-08-30 DIAGNOSIS — H6981 Other specified disorders of Eustachian tube, right ear: Secondary | ICD-10-CM | POA: Diagnosis not present

## 2016-08-30 MED ORDER — FLUTICASONE PROPIONATE 50 MCG/ACT NA SUSP: 2.0000 | Freq: Every day | NASAL | 0 refills | Status: AC

## 2016-08-30 NOTE — Progress Notes (Signed)
Subjective:     Patient ID: Lysle Morales, male   DOB: [id number], 60 y.o.   MRN: [id number]  HPI  Chief Complaint  Patient presents with  . Ear Pain    Patient comes in to office today with complaints of burning and itching in his right ear and descreased hearing for the past 7 days. Patient states that he has taken otc ear wax drops  He also states right nostril was clogged up and responded to an otc nasal spray. Denies precedent allergy or cold symptoms. Reports a popping/crackling sensation. Also here to update immunizations.   Review of Systems     Objective:   Physical Exam  Constitutional: He appears well-developed and well-nourished. No distress.  HENT:  L Canal patent and TM WNL R canal patent with small amount of wax/debris adjacent to TM which appears intact and not inflamed. No tragal tenderness       Assessment:    1. Need for tetanus booster - Tdap vaccine greater than or equal to 7yo IM  2. Need for shingles vaccine - Varicella-zoster vaccine subcutaneous  3. Eustachian tube dysfunction, right - fluticasone (FLONASE) 50 MCG/ACT nasal spray; Place 2 sprays into both nostrils daily.  Dispense: 16 g; Refill: 0  4. Need for influenza vaccination - Flu Vaccine QUAD 36+ mos IM    Plan:    Consider  ENT referral if not improving.

## 2016-08-30 NOTE — Patient Instructions (Signed)
Let us know if your ear does not improve over the next week.

## 2016-09-08 ENCOUNTER — Other Ambulatory Visit: Payer: Self-pay | Admitting: Family Medicine

## 2016-09-08 NOTE — Telephone Encounter (Signed)
Please advise refill? 

## 2016-09-08 NOTE — Telephone Encounter (Signed)
Pt states he has had to get a new glucose meter and is requesting a Rx for test stripes.  Pt new meter is a One Touch mini.  [place] CB#[phone]/MW  This is a Dr Caryn Section pt/MW

## 2016-09-09 DIAGNOSIS — R69 Illness, unspecified: Secondary | ICD-10-CM | POA: Diagnosis not present

## 2016-09-09 MED ORDER — GLUCOSE BLOOD VI STRP: 1.0000 | ORAL_STRIP | 3 refills | Status: DC | PRN

## 2016-09-09 NOTE — Telephone Encounter (Signed)
Rx called in to pharmacy. 

## 2016-09-09 NOTE — Telephone Encounter (Signed)
Will refill test strips. Remind patient he is due for follow up appointment and recheck of blood work.

## 2016-09-09 NOTE — Addendum Note (Signed)
Addended by: Julieta Bellini on: 09/09/2016 04:33 PM   Modules accepted: Orders

## 2016-09-16 DIAGNOSIS — H02833 Dermatochalasis of right eye, unspecified eyelid: Secondary | ICD-10-CM | POA: Diagnosis not present

## 2016-09-16 DIAGNOSIS — E119 Type 2 diabetes mellitus without complications: Secondary | ICD-10-CM | POA: Diagnosis not present

## 2016-09-16 DIAGNOSIS — H2513 Age-related nuclear cataract, bilateral: Secondary | ICD-10-CM | POA: Diagnosis not present

## 2016-09-16 DIAGNOSIS — H18413 Arcus senilis, bilateral: Secondary | ICD-10-CM | POA: Diagnosis not present

## 2016-09-26 DIAGNOSIS — H2513 Age-related nuclear cataract, bilateral: Secondary | ICD-10-CM | POA: Diagnosis not present

## 2016-09-30 DIAGNOSIS — I10 Essential (primary) hypertension: Secondary | ICD-10-CM | POA: Diagnosis not present

## 2016-10-05 DIAGNOSIS — E785 Hyperlipidemia, unspecified: Secondary | ICD-10-CM | POA: Diagnosis not present

## 2016-10-05 DIAGNOSIS — J449 Chronic obstructive pulmonary disease, unspecified: Secondary | ICD-10-CM | POA: Diagnosis not present

## 2016-10-05 DIAGNOSIS — I10 Essential (primary) hypertension: Secondary | ICD-10-CM | POA: Diagnosis not present

## 2016-10-05 DIAGNOSIS — Z6841 Body Mass Index (BMI) 40.0 and over, adult: Secondary | ICD-10-CM | POA: Diagnosis not present

## 2016-10-05 DIAGNOSIS — E119 Type 2 diabetes mellitus without complications: Secondary | ICD-10-CM | POA: Diagnosis not present

## 2016-10-05 DIAGNOSIS — Z0000 Encounter for general adult medical examination without abnormal findings: Secondary | ICD-10-CM | POA: Diagnosis not present

## 2016-10-12 DIAGNOSIS — H6121 Impacted cerumen, right ear: Secondary | ICD-10-CM | POA: Diagnosis not present

## 2016-10-12 DIAGNOSIS — H60331 Swimmer's ear, right ear: Secondary | ICD-10-CM | POA: Diagnosis not present

## 2016-11-07 DIAGNOSIS — H2512 Age-related nuclear cataract, left eye: Secondary | ICD-10-CM | POA: Diagnosis not present

## 2016-11-08 DIAGNOSIS — H2511 Age-related nuclear cataract, right eye: Secondary | ICD-10-CM | POA: Diagnosis not present

## 2016-11-23 DIAGNOSIS — J449 Chronic obstructive pulmonary disease, unspecified: Secondary | ICD-10-CM | POA: Diagnosis not present

## 2016-11-23 DIAGNOSIS — I10 Essential (primary) hypertension: Secondary | ICD-10-CM | POA: Diagnosis not present

## 2016-11-23 DIAGNOSIS — E785 Hyperlipidemia, unspecified: Secondary | ICD-10-CM | POA: Diagnosis not present

## 2016-11-23 DIAGNOSIS — G4733 Obstructive sleep apnea (adult) (pediatric): Secondary | ICD-10-CM | POA: Diagnosis not present

## 2016-11-23 DIAGNOSIS — E119 Type 2 diabetes mellitus without complications: Secondary | ICD-10-CM | POA: Diagnosis not present

## 2016-11-28 DIAGNOSIS — H2511 Age-related nuclear cataract, right eye: Secondary | ICD-10-CM | POA: Diagnosis not present

## 2016-12-08 DIAGNOSIS — I10 Essential (primary) hypertension: Secondary | ICD-10-CM | POA: Diagnosis not present

## 2017-01-13 DIAGNOSIS — I10 Essential (primary) hypertension: Secondary | ICD-10-CM | POA: Diagnosis not present

## 2017-01-17 DIAGNOSIS — I10 Essential (primary) hypertension: Secondary | ICD-10-CM | POA: Diagnosis not present

## 2017-01-17 DIAGNOSIS — J449 Chronic obstructive pulmonary disease, unspecified: Secondary | ICD-10-CM | POA: Diagnosis not present

## 2017-01-17 DIAGNOSIS — E119 Type 2 diabetes mellitus without complications: Secondary | ICD-10-CM | POA: Diagnosis not present

## 2017-01-17 DIAGNOSIS — G4733 Obstructive sleep apnea (adult) (pediatric): Secondary | ICD-10-CM | POA: Diagnosis not present

## 2017-01-17 DIAGNOSIS — R42 Dizziness and giddiness: Secondary | ICD-10-CM | POA: Diagnosis not present

## 2017-01-17 DIAGNOSIS — R0789 Other chest pain: Secondary | ICD-10-CM | POA: Diagnosis not present

## 2017-02-10 ENCOUNTER — Other Ambulatory Visit: Payer: Self-pay | Admitting: Family Medicine

## 2017-02-10 NOTE — Telephone Encounter (Signed)
Pharmacy requesting refills. Pharmacy listed is correct.

## 2017-02-24 DIAGNOSIS — E119 Type 2 diabetes mellitus without complications: Secondary | ICD-10-CM | POA: Diagnosis not present

## 2017-02-24 DIAGNOSIS — I10 Essential (primary) hypertension: Secondary | ICD-10-CM | POA: Diagnosis not present

## 2017-03-02 DIAGNOSIS — I10 Essential (primary) hypertension: Secondary | ICD-10-CM | POA: Diagnosis not present

## 2017-03-02 DIAGNOSIS — E119 Type 2 diabetes mellitus without complications: Secondary | ICD-10-CM | POA: Diagnosis not present

## 2017-03-02 DIAGNOSIS — E785 Hyperlipidemia, unspecified: Secondary | ICD-10-CM | POA: Diagnosis not present

## 2017-03-13 ENCOUNTER — Telehealth: Payer: Self-pay | Admitting: Family Medicine

## 2017-03-13 NOTE — Telephone Encounter (Signed)
Pt needs refill on his losartan-hydrochlorothiazide (HYZAAR) 100-25 MG tablet  He uses [facility] [address]

## 2017-03-13 NOTE — Telephone Encounter (Signed)
Please advise. Thanks.  

## 2017-03-14 MED ORDER — LOSARTAN POTASSIUM-HCTZ 100-25 MG PO TABS: 1.0000 | ORAL_TABLET | Freq: Every day | ORAL | 5 refills | Status: DC

## 2017-03-29 DIAGNOSIS — E113393 Type 2 diabetes mellitus with moderate nonproliferative diabetic retinopathy without macular edema, bilateral: Secondary | ICD-10-CM | POA: Diagnosis not present

## 2017-03-31 DIAGNOSIS — E119 Type 2 diabetes mellitus without complications: Secondary | ICD-10-CM | POA: Diagnosis not present

## 2017-03-31 DIAGNOSIS — I10 Essential (primary) hypertension: Secondary | ICD-10-CM | POA: Diagnosis not present

## 2017-05-03 DIAGNOSIS — I10 Essential (primary) hypertension: Secondary | ICD-10-CM | POA: Diagnosis not present

## 2017-05-03 DIAGNOSIS — E119 Type 2 diabetes mellitus without complications: Secondary | ICD-10-CM | POA: Diagnosis not present

## 2017-05-09 ENCOUNTER — Ambulatory Visit (INDEPENDENT_AMBULATORY_CARE_PROVIDER_SITE_OTHER): Payer: Medicare HMO

## 2017-05-09 VITALS — BP 112/80 | HR 100 | Temp 98.8°F | Ht 69.0 in | Wt 391.6 lb

## 2017-05-09 DIAGNOSIS — Z0000 Encounter for general adult medical examination without abnormal findings: Secondary | ICD-10-CM

## 2017-05-09 NOTE — Patient Instructions (Signed)
Jeremy Hendrix , Thank you for taking time to come for your Medicare Wellness Visit. I appreciate your ongoing commitment to your health goals. Please review the following plan we discussed and let me know if I can assist you in the future.   Screening recommendations/referrals: Colonoscopy: completed 07/21/15, due 07/2025 Recommended yearly ophthalmology/optometry visit for glaucoma screening and checkup Recommended yearly dental visit for hygiene and checkup  Vaccinations: Influenza vaccine: up to date, due 08/2017 Pneumococcal vaccine: due at age 54 Tdap vaccine: completed 08/30/16, due 08/2026 Shingles vaccine: completed 08/30/16   Advanced directives: Advance directive discussed with you today. I have provided a copy for you to complete at home and have notarized. Once this is complete please bring a copy in to our office so we can scan it into your chart.  Conditions/risks identified: Obesity; Recommend to continue to cut down on carbohydrates in daily diet.  Next appointment: None, need to schedule a 1 year AWV.  Preventive Care 40-64 Years, Male Preventive care refers to lifestyle choices and visits with your health care provider that can promote health and wellness. What does preventive care include?  A yearly physical exam. This is also called an annual well check.  Dental exams once or twice a year.  Routine eye exams. Ask your health care provider how often you should have your eyes checked.  Personal lifestyle choices, including:  Daily care of your teeth and gums.  Regular physical activity.  Eating a healthy diet.  Avoiding tobacco and drug use.  Limiting alcohol use.  Practicing safe sex.  Taking low-dose aspirin every day starting at age 80. What happens during an annual well check? The services and screenings done by your health care provider during your annual well check will depend on your age, overall health, lifestyle risk factors, and family history  of disease. Counseling  Your health care provider may ask you questions about your:  Alcohol use.  Tobacco use.  Drug use.  Emotional well-being.  Home and relationship well-being.  Sexual activity.  Eating habits.  Work and work Statistician. Screening  You may have the following tests or measurements:  Height, weight, and BMI.  Blood pressure.  Lipid and cholesterol levels. These may be checked every 5 years, or more frequently if you are over 97 years old.  Skin check.  Lung cancer screening. You may have this screening every year starting at age 55 if you have a 30-pack-year history of smoking and currently smoke or have quit within the past 15 years.  Fecal occult blood test (FOBT) of the stool. You may have this test every year starting at age 53.  Flexible sigmoidoscopy or colonoscopy. You may have a sigmoidoscopy every 5 years or a colonoscopy every 10 years starting at age 17.  Prostate cancer screening. Recommendations will vary depending on your family history and other risks.  Hepatitis C blood test.  Hepatitis B blood test.  Sexually transmitted disease (STD) testing.  Diabetes screening. This is done by checking your blood sugar (glucose) after you have not eaten for a while (fasting). You may have this done every 1-3 years. Discuss your test results, treatment options, and if necessary, the need for more tests with your health care provider. Vaccines  Your health care provider may recommend certain vaccines, such as:  Influenza vaccine. This is recommended every year.  Tetanus, diphtheria, and acellular pertussis (Tdap, Td) vaccine. You may need a Td booster every 10 years.  Zoster vaccine. You may need this  after age 71.  Pneumococcal 13-valent conjugate (PCV13) vaccine. You may need this if you have certain conditions and have not been vaccinated.  Pneumococcal polysaccharide (PPSV23) vaccine. You may need one or two doses if you smoke  cigarettes or if you have certain conditions. Talk to your health care provider about which screenings and vaccines you need and how often you need them. This information is not intended to replace advice given to you by your health care provider. Make sure you discuss any questions you have with your health care provider. Document Released: 01/01/2016 Document Revised: 08/24/2016 Document Reviewed: 10/06/2015 Elsevier Interactive Patient Education  2017 [place] Prevention in the Home Falls can cause injuries. They can happen to people of all ages. There are many things you can do to make your home safe and to help prevent falls. What can I do on the outside of my home?  Regularly fix the edges of walkways and driveways and fix any cracks.  Remove anything that might make you trip as you walk through a door, such as a raised step or threshold.  Trim any bushes or trees on the path to your home.  Use bright outdoor lighting.  Clear any walking paths of anything that might make someone trip, such as rocks or tools.  Regularly check to see if handrails are loose or broken. Make sure that both sides of any steps have handrails.  Any raised decks and porches should have guardrails on the edges.  Have any leaves, snow, or ice cleared regularly.  Use sand or salt on walking paths during winter.  Clean up any spills in your garage right away. This includes oil or grease spills. What can I do in the bathroom?  Use night lights.  Install grab bars by the toilet and in the tub and shower. Do not use towel bars as grab bars.  Use non-skid mats or decals in the tub or shower.  If you need to sit down in the shower, use a plastic, non-slip stool.  Keep the floor dry. Clean up any water that spills on the floor as soon as it happens.  Remove soap buildup in the tub or shower regularly.  Attach bath mats securely with double-sided non-slip rug tape.  Do not have throw rugs  and other things on the floor that can make you trip. What can I do in the bedroom?  Use night lights.  Make sure that you have a light by your bed that is easy to reach.  Do not use any sheets or blankets that are too big for your bed. They should not hang down onto the floor.  Have a firm chair that has side arms. You can use this for support while you get dressed.  Do not have throw rugs and other things on the floor that can make you trip. What can I do in the kitchen?  Clean up any spills right away.  Avoid walking on wet floors.  Keep items that you use a lot in easy-to-reach places.  If you need to reach something above you, use a strong step stool that has a grab bar.  Keep electrical cords out of the way.  Do not use floor polish or wax that makes floors slippery. If you must use wax, use non-skid floor wax.  Do not have throw rugs and other things on the floor that can make you trip. What can I do with my stairs?  Do not leave  any items on the stairs.  Make sure that there are handrails on both sides of the stairs and use them. Fix handrails that are broken or loose. Make sure that handrails are as long as the stairways.  Check any carpeting to make sure that it is firmly attached to the stairs. Fix any carpet that is loose or worn.  Avoid having throw rugs at the top or bottom of the stairs. If you do have throw rugs, attach them to the floor with carpet tape.  Make sure that you have a light switch at the top of the stairs and the bottom of the stairs. If you do not have them, ask someone to add them for you. What else can I do to help prevent falls?  Wear shoes that:  Do not have high heels.  Have rubber bottoms.  Are comfortable and fit you well.  Are closed at the toe. Do not wear sandals.  If you use a stepladder:  Make sure that it is fully opened. Do not climb a closed stepladder.  Make sure that both sides of the stepladder are locked into  place.  Ask someone to hold it for you, if possible.  Clearly mark and make sure that you can see:  Any grab bars or handrails.  First and last steps.  Where the edge of each step is.  Use tools that help you move around (mobility aids) if they are needed. These include:  Canes.  Walkers.  Scooters.  Crutches.  Turn on the lights when you go into a dark area. Replace any light bulbs as soon as they burn out.  Set up your furniture so you have a clear path. Avoid moving your furniture around.  If any of your floors are uneven, fix them.  If there are any pets around you, be aware of where they are.  Review your medicines with your doctor. Some medicines can make you feel dizzy. This can increase your chance of falling. Ask your doctor what other things that you can do to help prevent falls. This information is not intended to replace advice given to you by your health care provider. Make sure you discuss any questions you have with your health care provider. Document Released: 10/01/2009 Document Revised: 05/12/2016 Document Reviewed: 01/09/2015 Elsevier Interactive Patient Education  2017 [employer].

## 2017-05-09 NOTE — Progress Notes (Signed)
Subjective:   Jeremy Hendrix is a 61 y.o. male who presents for an Initial Medicare Annual Wellness Visit.  Review of Systems  N/A  Cardiac Risk Factors include: advanced age (>29men, >68 women);obesity (BMI >30kg/m2);diabetes mellitus;dyslipidemia;hypertension;male gender    Objective:    Today's Vitals   05/09/17 1515  BP: 112/80  Pulse: 100  Temp: 98.8 F (37.1 C)  TempSrc: Oral  Weight: (!) 391 lb 9.6 oz (177.6 kg)  Height: 5\' 9"  (1.753 m)  PainSc: 0-No pain   Body mass index is 57.83 kg/m.  Current Medications (verified) Outpatient Encounter Prescriptions as of 05/09/2017  Medication Sig  . albuterol (PROVENTIL HFA;VENTOLIN HFA) 108 (90 Base) MCG/ACT inhaler Inhale 2 puffs into the lungs every 6 (six) hours as needed for wheezing or shortness of breath.  Marland Kitchen albuterol (VENTOLIN HFA) 108 (90 Base) MCG/ACT inhaler Inhale into the lungs.  Marland Kitchen allopurinol (ZYLOPRIM) 100 MG tablet TAKE ONE TABLET BY MOUTH ONCE DAILY  . aspirin 325 MG tablet Take 325 mg by mouth daily.  Marland Kitchen atorvastatin (LIPITOR) 20 MG tablet Take 20 mg by mouth daily.  . Cholecalciferol (VITAMIN D) 2000 UNITS CAPS Take by mouth.  . ciprofloxacin (CIPRO) 250 MG/5ML (5%) SUSR Take by mouth.  . diltiazem (DILACOR XR) 180 MG 24 hr capsule Take 240 mg by mouth.   . Fluticasone-Salmeterol (ADVAIR) 250-50 MCG/DOSE AEPB Inhale 1 puff into the lungs 2 (two) times daily.  Marland Kitchen glucose blood test strip 1 each by Other route as needed for other. One Touch Mini Test Strip Dx code:E11.9  . losartan-hydrochlorothiazide (HYZAAR) 100-25 MG tablet Take 1 tablet by mouth daily.  . metFORMIN (GLUCOPHAGE) 500 MG tablet Take by mouth every morning.  . MULTIPLE VITAMINS-MINERALS PO Take by mouth.  . neomycin-polymyxin-hydrocortisone (CORTISPORIN) otic solution PLACE 4 DROPS INTO THE AFFECTED EAR EVERY 6 HOURS AS NEEDED.  Marland Kitchen Omega-3 Fatty Acids (FISH OIL) 1000 MG CAPS Take by mouth.  . saw palmetto 160 MG capsule Take 160 mg by mouth  2 (two) times daily.  Marland Kitchen SPIRIVA HANDIHALER 18 MCG inhalation capsule INHALE ONE DOSE BY MOUTH ONCE DAILY  . vitamin E 400 UNIT capsule Take 400 Units by mouth daily.  . fluticasone (FLONASE) 50 MCG/ACT nasal spray Place 2 sprays into both nostrils daily. (Patient not taking: Reported on 05/09/2017)  . ketoconazole (NIZORAL) 2 % shampoo   . LECITHIN PO Take by mouth.  . metFORMIN (GLUCOPHAGE-XR) 500 MG 24 hr tablet   . TAZTIA XT 240 MG 24 hr capsule    No facility-administered encounter medications on file as of 05/09/2017.     Allergies (verified) Percocet [oxycodone-acetaminophen]   History: Past Medical History:  Diagnosis Date  . Anxiety   . COPD (chronic obstructive pulmonary disease) ([place])   . Diabetes mellitus without complication ([place])   . Fatigue   . Gout   . Hyperlipidemia   . Hypertension   . Lateral epicondylitis of right elbow   . Pruritus   . Seborrheic dermatitis   . Toe pain    Past Surgical History:  Procedure Laterality Date  . CATARACT EXTRACTION, BILATERAL    . COLONOSCOPY WITH PROPOFOL N/A 07/21/2015   Procedure: COLONOSCOPY WITH PROPOFOL;  Surgeon: Lucilla Lame, MD;  Location: [facility] ENDOSCOPY;  Service: Endoscopy;  Laterality: N/A;  . HIP SURGERY     Family History  Problem Relation Age of Onset  . Myasthenia gravis Mother   . Heart disease Father   . Healthy Sister    Social  History   Occupational History  . Not on file.   Social History Main Topics  . Smoking status: Former Smoker    Packs/day: 1.00    Years: 18.00    Types: Cigarettes  . Smokeless tobacco: Never Used     Comment: QUIT IN 2000  . Alcohol use No  . Drug use: No  . Sexual activity: Not on file   Tobacco Counseling Counseling given: Not Answered   Activities of Daily Living In your present state of health, do you have any difficulty performing the following activities: 05/09/2017  Hearing? Y  Vision? N  Difficulty concentrating or making decisions? N  Walking or  climbing stairs? Y  Dressing or bathing? N  Preparing Food and eating ? N  Using the Toilet? N  In the past six months, have you accidently leaked urine? Y  Do you have problems with loss of bowel control? Y  Managing your Medications? N  Managing your Finances? N  Housekeeping or managing your Housekeeping? N  Some recent data might be hidden    Immunizations and Health Maintenance Immunization History  Administered Date(s) Administered  . Influenza,inj,Quad PF,36+ Mos 09/21/2015, 08/30/2016  . Pneumococcal Polysaccharide-23 04/20/2015  . Tdap 08/30/2016  . Zoster 08/30/2016   Health Maintenance Due  Topic Date Due  . HEMOGLOBIN A1C  August 25, 1956  . Hepatitis C Screening  06-May-1956  . FOOT EXAM  07/07/2016    Patient Care Team: Birdie Sons, MD as PCP - General (Family Medicine) Lorelee Cover., MD as Consulting Physician (Ophthalmology) Charolette Forward, MD as Consulting Physician (Cardiology) Brett Albino, MD as Referring Physician (Internal Medicine) Erby Pian, MD as Referring Physician (Specialist) Frederik Pear, MD as Consulting Physician (Orthopedic Surgery) Lollie Sails, MD as Referring Physician (Internal Medicine)  Indicate any recent Medical Services you may have received from other than [facility] providers in the past year (date may be approximate).    Assessment:   This is a routine wellness examination for Jeremy Hendrix.   Hearing/Vision screen Vision Screening Comments: Pt sees Dr Gloriann Loan for vision checks 1-2 times yearly.  Dietary issues and exercise activities discussed: Current Exercise Habits: Structured exercise class, Type of exercise: Other - see comments;strength training/weights (bike, cross trainer), Time (Minutes): 60, Frequency (Times/Week): 3, Weekly Exercise (Minutes/Week): 180, Intensity: Mild  Goals    . diet          Recommend to continue to cut down on carbohydrates in daily diet.      Depression Screen PHQ 2/9 Scores  05/09/2017 05/09/2017  PHQ - 2 Score 0 0  PHQ- 9 Score 2 -    Fall Risk Fall Risk  05/09/2017  Falls in the past year? No    Cognitive Function:     6CIT Screen 05/09/2017  What Year? 0 points  What month? 0 points  What time? 0 points  Count back from 20 0 points  Months in reverse 0 points  Repeat phrase 0 points  Total Score 0    Screening Tests Health Maintenance  Topic Date Due  . HEMOGLOBIN A1C  06/27/1956  . Hepatitis C Screening  1956-10-11  . FOOT EXAM  07/07/2016  . HIV Screening  12/19/2026 (Originally 03/29/1971)  . INFLUENZA VACCINE  07/19/2017  . OPHTHALMOLOGY EXAM  12/19/2017  . PNEUMOCOCCAL POLYSACCHARIDE VACCINE (2) 04/19/2020  . COLONOSCOPY  07/20/2025  . TETANUS/TDAP  08/30/2026        Plan:  I have personally reviewed and addressed the Medicare Annual  Wellness questionnaire and have noted the following in the patient's chart:  A. Medical and social history B. Use of alcohol, tobacco or illicit drugs  C. Current medications and supplements D. Functional ability and status E.  Nutritional status F.  Physical activity G. Advance directives H. List of other physicians I.  Hospitalizations, surgeries, and ER visits in previous 12 months J.  [place] such as hearing and vision if needed, cognitive and depression L. Referrals and appointments - none  In addition, I have reviewed and discussed with patient certain preventive protocols, quality metrics, and best practice recommendations. A written personalized care plan for preventive services as well as general preventive health recommendations were provided to patient.  See attached scanned questionnaire for additional information.   Signed,  Fabio Neighbors, LPN Nurse Health Advisor   MD Recommendations: Pt declined HIV screening. Pt would like his blood work done all together at next OV with PCP. Pt needs Hgb A1c, hepatitis C screening and a foot exam.  I have reviewed the health  advisor's note, was available for consultation, and agree with documentation and plan  Lelon Huh, MD

## 2017-05-11 DIAGNOSIS — E119 Type 2 diabetes mellitus without complications: Secondary | ICD-10-CM | POA: Diagnosis not present

## 2017-05-11 DIAGNOSIS — E785 Hyperlipidemia, unspecified: Secondary | ICD-10-CM | POA: Diagnosis not present

## 2017-05-11 DIAGNOSIS — K589 Irritable bowel syndrome without diarrhea: Secondary | ICD-10-CM | POA: Diagnosis not present

## 2017-05-11 DIAGNOSIS — J449 Chronic obstructive pulmonary disease, unspecified: Secondary | ICD-10-CM | POA: Diagnosis not present

## 2017-05-11 DIAGNOSIS — G4733 Obstructive sleep apnea (adult) (pediatric): Secondary | ICD-10-CM | POA: Diagnosis not present

## 2017-05-11 DIAGNOSIS — I10 Essential (primary) hypertension: Secondary | ICD-10-CM | POA: Diagnosis not present

## 2017-06-05 ENCOUNTER — Encounter: Payer: Self-pay | Admitting: Family Medicine

## 2017-06-05 ENCOUNTER — Ambulatory Visit (INDEPENDENT_AMBULATORY_CARE_PROVIDER_SITE_OTHER): Payer: Medicare HMO | Admitting: Family Medicine

## 2017-06-05 VITALS — BP 130/82 | HR 88 | Temp 99.2°F | Resp 18 | Ht 69.0 in | Wt 395.0 lb

## 2017-06-05 DIAGNOSIS — Z125 Encounter for screening for malignant neoplasm of prostate: Secondary | ICD-10-CM

## 2017-06-05 DIAGNOSIS — Z0000 Encounter for general adult medical examination without abnormal findings: Secondary | ICD-10-CM

## 2017-06-05 DIAGNOSIS — Z1159 Encounter for screening for other viral diseases: Secondary | ICD-10-CM | POA: Diagnosis not present

## 2017-06-05 DIAGNOSIS — E119 Type 2 diabetes mellitus without complications: Secondary | ICD-10-CM

## 2017-06-05 DIAGNOSIS — I10 Essential (primary) hypertension: Secondary | ICD-10-CM

## 2017-06-05 DIAGNOSIS — J449 Chronic obstructive pulmonary disease, unspecified: Secondary | ICD-10-CM | POA: Diagnosis not present

## 2017-06-05 NOTE — Patient Instructions (Signed)

## 2017-06-05 NOTE — Progress Notes (Signed)
Patient: Jeremy Hendrix, Male    DOB: [birth year], 61 y.o.   MRN: [id number] Visit Date: 06/05/2017  Today's Provider: Lelon Huh, MD   Chief Complaint  Patient presents with  . Annual Exam  . Diabetes    follow up  . Hypertension    follow up   Subjective:    Annual physical exam Jeremy Hendrix is a 61 y.o. male who presents today for health maintenance and complete physical. He feels fairly well. He reports exercising 1-2 times a week. He reports he is sleeping fairly well.  -----------------------------------------------------------------  Diabetes Mellitus Type II, Follow-up:   No results found for: HGBA1C  Last seen for diabetes more than 1 years ago. Patient was being followed by Endocrinology, but reports that it has been over a year since the last follow up.   He reports good compliance with treatment. He is not having side effects.  Current symptoms include none and have been stable. Home blood sugar records: fasting range: 117-125  Episodes of hypoglycemia? no   Current Insulin Regimen: none Most Recent Eye Exam: < 1 year ago Weight trend: increasing steadily Prior visit with dietician: yes Current diet: in general, a "healthy" diet   Current exercise: cardiovascular workout on exercise equipment and weightlifting  Pertinent Labs:    Component Value Date/Time   CREATININE 1.09 09/09/2014 1404    Wt Readings from Last 3 Encounters:  05/09/17 (!) 391 lb 9.6 oz (177.6 kg)  08/30/16 (!) 390 lb 3.2 oz (177 kg)  10/16/15 (!) 369 lb 9.6 oz (167.6 kg)    ------------------------------------------------------------------------  Hypertension, follow-up:  BP Readings from Last 3 Encounters:  05/09/17 112/80  08/30/16 110/82  10/16/15 122/78    He was last seen for hypertension more than 1 years ago.   He reports fair compliance with treatment. He is not having side effects.  He is exercising. He is adherent to low salt diet.   He is  experiencing palpitations.  Patient denies chest pain, chest pressure/discomfort, claudication, dyspnea, exertional chest pressure/discomfort, irregular heart beat, lower extremity edema, orthopnea, paroxysmal nocturnal dyspnea, syncope and tachypnea.   Cardiovascular risk factors include advanced age (older than 49 for men, 61 for women), diabetes mellitus, hypertension, male gender and obesity (BMI >= 30 kg/m2).  Use of agents associated with hypertension: NSAIDS.     Weight trend: increasing steadily Wt Readings from Last 3 Encounters:  05/09/17 (!) 391 lb 9.6 oz (177.6 kg)  08/30/16 (!) 390 lb 3.2 oz (177 kg)  10/16/15 (!) 369 lb 9.6 oz (167.6 kg)    Current diet: in general, a "healthy" diet    ------------------------------------------------------------------------   Review of Systems  Constitutional: Positive for unexpected weight change. Negative for appetite change, chills, fatigue and fever.  HENT: Negative for congestion, ear pain, hearing loss, nosebleeds and trouble swallowing.   Eyes: Positive for itching. Negative for pain and visual disturbance.  Respiratory: Positive for cough, shortness of breath and wheezing. Negative for chest tightness.   Cardiovascular: Positive for palpitations. Negative for chest pain and leg swelling.  Gastrointestinal: Positive for constipation and diarrhea. Negative for abdominal pain, blood in stool, nausea and vomiting.  Endocrine: Negative for polydipsia, polyphagia and polyuria.  Genitourinary: Positive for frequency. Negative for dysuria and flank pain.  Musculoskeletal: Negative for arthralgias, back pain, joint swelling, myalgias and neck stiffness.  Skin: Negative for color change, rash and wound.  Allergic/Immunologic: Positive for environmental allergies.  Neurological: Negative for dizziness, tremors,  seizures, speech difficulty, weakness, light-headedness and headaches.  Psychiatric/Behavioral: Positive for dysphoric mood.  Negative for behavioral problems, confusion, decreased concentration and sleep disturbance. The patient is not nervous/anxious.   All other systems reviewed and are negative.   Social History      He  reports that he has quit smoking. His smoking use included Cigarettes. He has a 18.00 pack-year smoking history. He has never used smokeless tobacco. He reports that he does not drink alcohol or use drugs.       Social History   Social History  . Marital status: Single    Spouse name: N/A  . Number of children: N/A  . Years of education: N/A   Social History Main Topics  . Smoking status: Former Smoker    Packs/day: 1.00    Years: 18.00    Types: Cigarettes  . Smokeless tobacco: Never Used     Comment: QUIT IN 2000  . Alcohol use No  . Drug use: No  . Sexual activity: Not Asked   Other Topics Concern  . None   Social History Narrative  . None    Past Medical History:  Diagnosis Date  . Anxiety   . COPD (chronic obstructive pulmonary disease) ([place])   . Diabetes mellitus without complication ([place])   . Fatigue   . Gout   . Hyperlipidemia   . Hypertension   . Lateral epicondylitis of right elbow   . Pruritus   . Seborrheic dermatitis   . Toe pain      Patient Active Problem List   Diagnosis Date Noted  . Anxiety 09/10/2015  . CAFL (chronic airflow limitation) ([place]) 09/10/2015  . Diabetes ([place]) 09/10/2015  . Gout 09/10/2015  . Familial multiple lipoprotein-type hyperlipidemia 09/10/2015  . BP (high blood pressure) 09/10/2015  . Eczema intertrigo 09/10/2015  . Backhand tennis elbow 09/10/2015  . Special screening for malignant neoplasms, colon   . Benign neoplasm of ascending colon   . Benign neoplasm of sigmoid colon   . Breathlessness on exertion 07/02/2014  . Moderate COPD (chronic obstructive pulmonary disease) ([place]) 07/02/2014    Past Surgical History:  Procedure Laterality Date  . CATARACT EXTRACTION, BILATERAL    . COLONOSCOPY WITH PROPOFOL N/A  07/21/2015   Procedure: COLONOSCOPY WITH PROPOFOL;  Surgeon: Lucilla Lame, MD;  Location: [facility] ENDOSCOPY;  Service: Endoscopy;  Laterality: N/A;  . HIP SURGERY      Family History        Family Status  Relation Status  . Mother Deceased  . Father Alive  . Sister Alive        His family history includes Healthy in his sister; Heart disease in his father; Myasthenia gravis in his mother.     Allergies  Allergen Reactions  . Percocet [Oxycodone-Acetaminophen]      Current Outpatient Prescriptions:  .  albuterol (VENTOLIN HFA) 108 (90 Base) MCG/ACT inhaler, Inhale into the lungs., Disp: , Rfl:  .  allopurinol (ZYLOPRIM) 100 MG tablet, TAKE ONE TABLET BY MOUTH ONCE DAILY, Disp: 30 tablet, Rfl: 5 .  aspirin 325 MG tablet, Take 325 mg by mouth daily., Disp: , Rfl:  .  atorvastatin (LIPITOR) 20 MG tablet, Take 20 mg by mouth daily., Disp: , Rfl:  .  Cholecalciferol (VITAMIN D) 2000 UNITS CAPS, Take by mouth., Disp: , Rfl:  .  ciprofloxacin (CIPRO) 250 MG/5ML (5%) SUSR, Take by mouth., Disp: , Rfl:  .  diltiazem (DILACOR XR) 180 MG 24 hr capsule, Take 240 mg  by mouth. , Disp: , Rfl:  .  fluticasone (FLONASE) 50 MCG/ACT nasal spray, Place 2 sprays into both nostrils daily., Disp: 16 g, Rfl: 0 .  Fluticasone-Salmeterol (ADVAIR) 250-50 MCG/DOSE AEPB, Inhale 1 puff into the lungs 2 (two) times daily., Disp: , Rfl:  .  glucose blood test strip, 1 each by Other route as needed for other. One Touch Mini Test Strip Dx code:E11.9, Disp: 100 each, Rfl: 3 .  ketoconazole (NIZORAL) 2 % shampoo, , Disp: , Rfl:  .  LECITHIN PO, Take by mouth., Disp: , Rfl:  .  losartan-hydrochlorothiazide (HYZAAR) 100-25 MG tablet, Take 1 tablet by mouth daily., Disp: 30 tablet, Rfl: 5 .  metFORMIN (GLUCOPHAGE-XR) 500 MG 24 hr tablet, , Disp: , Rfl:  .  MULTIPLE VITAMINS-MINERALS PO, Take by mouth., Disp: , Rfl:  .  neomycin-polymyxin-hydrocortisone (CORTISPORIN) otic solution, PLACE 4 DROPS INTO THE AFFECTED EAR EVERY 6  HOURS AS NEEDED., Disp: 10 mL, Rfl: 0 .  Omega-3 Fatty Acids (FISH OIL) 1000 MG CAPS, Take by mouth., Disp: , Rfl:  .  saw palmetto 160 MG capsule, Take 160 mg by mouth 2 (two) times daily., Disp: , Rfl:  .  SPIRIVA HANDIHALER 18 MCG inhalation capsule, INHALE ONE DOSE BY MOUTH ONCE DAILY, Disp: 30 capsule, Rfl: 12 .  TAZTIA XT 240 MG 24 hr capsule, , Disp: , Rfl:  .  vitamin E 400 UNIT capsule, Take 400 Units by mouth daily., Disp: , Rfl:    Patient Care Team: Birdie Sons, MD as PCP - General (Family Medicine) Lorelee Cover., MD as Consulting Physician (Ophthalmology) Charolette Forward, MD as Consulting Physician (Cardiology) Brett Albino, MD as Referring Physician (Internal Medicine) Erby Pian, MD as Referring Physician (Specialist) Frederik Pear, MD as Consulting Physician (Orthopedic Surgery) Lollie Sails, MD as Referring Physician (Internal Medicine)      Objective:   Vitals: BP 130/82 (BP Location: Right Arm, Patient Position: Sitting, Cuff Size: Large)   Pulse 88   Temp 99.2 F (37.3 C) (Oral)   Resp 18   Ht 5\' 9"  (1.753 m)   Wt (!) 395 lb (179.2 kg)   BMI 58.33 kg/m   There were no vitals filed for this visit.   Physical Exam   General Appearance:    Alert, cooperative, no distress, appears stated age, morbidly obese  Head:    Normocephalic, without obvious abnormality, atraumatic  Eyes:    PERRL, conjunctiva/corneas clear, EOM's intact, fundi    benign, both eyes       Ears:    Normal TM's and external ear canals, both ears  Nose:   Nares normal, septum midline, mucosa normal, no drainage   or sinus tenderness  Throat:   Lips, mucosa, and tongue normal; teeth and gums normal  Neck:   Supple, symmetrical, trachea midline, no adenopathy;       thyroid:  No enlargement/tenderness/nodules; no carotid   bruit or JVD  Back:     Symmetric, no curvature, ROM normal, no CVA tenderness  Lungs:     Clear to auscultation bilaterally, respirations  unlabored  Chest wall:    No tenderness or deformity  Heart:    Regular rate and rhythm, S1 and S2 normal, no murmur, rub   or gallop  Abdomen:     Soft, non-tender, bowel sounds active all four quadrants,    no masses, no organomegaly  Genitalia:    deferred  Rectal:    deferred  Extremities:  Extremities normal, atraumatic, no cyanosis or edema  Pulses:   2+ and symmetric all extremities  Skin:   Skin color, texture, turgor normal, no rashes or lesions  Lymph nodes:   Cervical, supraclavicular, and axillary nodes normal  Neurologic:   CNII-XII intact. Normal strength, sensation and reflexes      throughout    Depression Screen PHQ 2/9 Scores 05/09/2017 05/09/2017  PHQ - 2 Score 0 0  PHQ- 9 Score 2 -      Assessment & Plan:     Routine Health Maintenance and Physical Exam  Exercise Activities and Dietary recommendations Goals    . diet          Recommend to continue to cut down on carbohydrates in daily diet.       Immunization History  Administered Date(s) Administered  . Influenza,inj,Quad PF,36+ Mos 09/21/2015, 08/30/2016  . Pneumococcal Polysaccharide-23 04/20/2015  . Tdap 08/30/2016  . Zoster 08/30/2016    Health Maintenance  Topic Date Due  . HEMOGLOBIN A1C  03[birth year]29[birth year]1956  . Hepatitis C Screening  [birth year]  . FOOT EXAM  07/07/2016  . HIV Screening  12/19/2026 (Originally 03/29/1971)  . INFLUENZA VACCINE  07/19/2017  . OPHTHALMOLOGY EXAM  12/19/2017  . PNEUMOCOCCAL POLYSACCHARIDE VACCINE (2) 04/19/2020  . COLONOSCOPY  07/20/2025  . TETANUS/TDAP  08/30/2026     Discussed health benefits of physical activity, and encouraged him to engage in regular exercise appropriate for his age and condition.    -------------------------------------------------------------------- 1. Annual physical exam   2. Essential hypertension  BP: 130/82   Well controlled.  Continue current medications.   - Comprehensive metabolic panel - CBC - Lipid panel  3.  Moderate COPD (chronic obstructive pulmonary disease) (HCC) Stable. Continue current inhlaer.   4. Type 2 diabetes mellitus without complication, without long-term current use of insulin (HCC)  - CBC - Lipid panel - Hemoglobin A1c  5. Need for hepatitis C screening test  - Hepatitis C antibody  6. Prostate cancer screening  - PSA  7. Morbid obesity ([place]) Counseled on detrimental effects of excessive weight on overall health. Counseled to get up to 150 minutes of moderate exercise a week and to reduce caloric intake on a diet he feels he can be most compliant with. Set goal to reach a weight of 340 pounds in the next six months.      Lelon Huh, MD  [facility]

## 2017-06-06 LAB — CBC
HEMATOCRIT: 49.1 % (ref 37.5–51.0)
HEMOGLOBIN: 16.5 g/dL (ref 13.0–17.7)
MCH: 31.1 pg (ref 26.6–33.0)
MCHC: 33.6 g/dL (ref 31.5–35.7)
MCV: 93 fL (ref 79–97)
Platelets: 248 10*3/uL (ref 150–379)
RBC: 5.3 x10E6/uL (ref 4.14–5.80)
RDW: 14.6 % (ref 12.3–15.4)
WBC: 10 10*3/uL (ref 3.4–10.8)

## 2017-06-06 LAB — HEPATITIS C ANTIBODY: Hep C Virus Ab: <0.1 s/co ratio (ref 0.0–0.9)

## 2017-06-06 LAB — COMPREHENSIVE METABOLIC PANEL
ALT: 39 IU/L (ref 0–44)
AST: 23 IU/L (ref 0–40)
Albumin/Globulin Ratio: 2 (ref 1.2–2.2)
Albumin: 4.5 g/dL (ref 3.6–4.8)
Alkaline Phosphatase: 49 IU/L (ref 39–117)
BILIRUBIN TOTAL: 1 mg/dL (ref 0.0–1.2)
BUN/Creatinine Ratio: 12 (ref 10–24)
BUN: 13 mg/dL (ref 8–27)
CALCIUM: 10 mg/dL (ref 8.6–10.2)
CO2: 23 mmol/L (ref 20–29)
CREATININE: 1.05 mg/dL (ref 0.76–1.27)
Chloride: 103 mmol/L (ref 96–106)
GFR, EST AFRICAN AMERICAN: 88 mL/min/{1.73_m2} (ref >59)
GFR, EST NON AFRICAN AMERICAN: 76 mL/min/{1.73_m2} (ref >59)
GLUCOSE: 113 mg/dL — AB (ref 65–99)
Globulin, Total: 2.3 g/dL (ref 1.5–4.5)
Potassium: 4.4 mmol/L (ref 3.5–5.2)
Sodium: 144 mmol/L (ref 134–144)
TOTAL PROTEIN: 6.8 g/dL (ref 6.0–8.5)

## 2017-06-06 LAB — PSA: Prostate Specific Ag, Serum: 2.2 ng/mL (ref 0.0–4.0)

## 2017-06-06 LAB — LIPID PANEL
CHOLESTEROL TOTAL: 157 mg/dL (ref 100–199)
Chol/HDL Ratio: 3 ratio (ref 0.0–5.0)
HDL: 53 mg/dL (ref >39)
LDL CALC: 84 mg/dL (ref 0–99)
Triglycerides: 101 mg/dL (ref 0–149)
VLDL CHOLESTEROL CAL: 20 mg/dL (ref 5–40)

## 2017-06-06 LAB — HEMOGLOBIN A1C
ESTIMATED AVERAGE GLUCOSE: 123 mg/dL
Hgb A1c MFr Bld: 5.9 % — ABNORMAL HIGH (ref 4.8–5.6)

## 2017-06-06 NOTE — Progress Notes (Signed)
Advised  ED 

## 2017-06-27 ENCOUNTER — Encounter: Payer: Self-pay | Admitting: Gastroenterology

## 2017-06-27 ENCOUNTER — Other Ambulatory Visit: Payer: Self-pay

## 2017-06-27 ENCOUNTER — Ambulatory Visit (INDEPENDENT_AMBULATORY_CARE_PROVIDER_SITE_OTHER): Payer: Medicare HMO | Admitting: Gastroenterology

## 2017-06-27 VITALS — BP 121/80 | Temp 98.2°F | Ht 69.0 in | Wt 389.6 lb

## 2017-06-27 DIAGNOSIS — K5900 Constipation, unspecified: Secondary | ICD-10-CM

## 2017-06-27 NOTE — Patient Instructions (Signed)
Take 1 cap full of Miralax daily for 5 days. If no improvement, add Citrucel daily. If still no improvement, contact our office.

## 2017-06-27 NOTE — Progress Notes (Signed)
Primary Care Physician: Birdie Sons, MD  Primary Gastroenterologist:  Dr. Lucilla Lame  Chief Complaint  Patient presents with  . Constipation    recently stopped metformin because diarrhea  now constipation feels like "pass a brick"    HPI: Jeremy Hendrix is a 61 y.o. male here for change in bowel habits.  The patient had a colonoscopy by me 2 years ago without any cause for his change in bowel habits.  The patient states that he is lost approximately 20 pounds by changing his diet and exercise.  The patient reports that when he was started on medication for his diabetes he started to have alternating diarrhea and constipation with the medication was stopped and now he is having resulting constipation only.  There is no report of any lack stools or bloody stools.  The patient also denies any fevers chills nausea or vomiting.  Current Outpatient Prescriptions  Medication Sig Dispense Refill  . albuterol (VENTOLIN HFA) 108 (90 Base) MCG/ACT inhaler Inhale into the lungs.    Marland Kitchen allopurinol (ZYLOPRIM) 100 MG tablet TAKE ONE TABLET BY MOUTH ONCE DAILY 30 tablet 5  . aspirin 325 MG tablet Take 325 mg by mouth daily.    Marland Kitchen atorvastatin (LIPITOR) 20 MG tablet Take 20 mg by mouth daily.    . Cholecalciferol (VITAMIN D) 2000 UNITS CAPS Take by mouth.    Marland Kitchen CIPRODEX OTIC suspension     . ciprofloxacin (CIPRO) 250 MG/5ML (5%) SUSR Take by mouth.    . diltiazem (DILACOR XR) 180 MG 24 hr capsule Take 240 mg by mouth.     . Fluticasone-Salmeterol (ADVAIR) 250-50 MCG/DOSE AEPB Inhale 1 puff into the lungs 2 (two) times daily.    Marland Kitchen glucose blood test strip 1 each by Other route as needed for other. One Touch Mini Test Strip Dx code:E11.9 100 each 3  . INCRUSE ELLIPTA 62.5 MCG/INH AEPB     . LECITHIN PO Take by mouth.    . losartan-hydrochlorothiazide (HYZAAR) 100-25 MG tablet Take 1 tablet by mouth daily. 30 tablet 5  . metFORMIN (GLUCOPHAGE-XR) 500 MG 24 hr tablet     . MULTIPLE  VITAMINS-MINERALS PO Take by mouth.    . neomycin-polymyxin-hydrocortisone (CORTISPORIN) otic solution PLACE 4 DROPS INTO THE AFFECTED EAR EVERY 6 HOURS AS NEEDED. 10 mL 0  . Omega-3 Fatty Acids (FISH OIL) 1000 MG CAPS Take by mouth.    . saw palmetto 160 MG capsule Take 160 mg by mouth 2 (two) times daily.    Marland Kitchen TAZTIA XT 240 MG 24 hr capsule     . vitamin E 400 UNIT capsule Take 400 Units by mouth daily.    . fluticasone (FLONASE) 50 MCG/ACT nasal spray Place 2 sprays into both nostrils daily. (Patient not taking: Reported on 06/27/2017) 16 g 0  . ketoconazole (NIZORAL) 2 % cream     . ketoconazole (NIZORAL) 2 % shampoo     . SPIRIVA HANDIHALER 18 MCG inhalation capsule INHALE ONE DOSE BY MOUTH ONCE DAILY (Patient not taking: Reported on 06/27/2017) 30 capsule 12   No current facility-administered medications for this visit.     Allergies as of 06/27/2017 - Review Complete 06/27/2017  Allergen Reaction Noted  . Percocet [oxycodone-acetaminophen]  07/20/2015    ROS:  General: Negative for anorexia, weight loss, fever, chills, fatigue, weakness. ENT: Negative for hoarseness, difficulty swallowing , nasal congestion. CV: Negative for chest pain, angina, palpitations, dyspnea on exertion, peripheral edema.  Respiratory: Negative for  dyspnea at rest, dyspnea on exertion, cough, sputum, wheezing.  GI: See history of present illness. GU:  Negative for dysuria, hematuria, urinary incontinence, urinary frequency, nocturnal urination.  Endo: Negative for unusual weight change.    Physical Examination:   BP 121/80 (Patient Position: Sitting)   Temp 98.2 F (36.8 C) (Oral)   Ht 5\' 9"  (1.753 m)   Wt (!) 389 lb 9.6 oz (176.7 kg)   BMI 57.53 kg/m   General: Well-nourished, Morbidly obese, well-developed in no acute distress.  Eyes: No icterus. Conjunctivae pink. Mouth: Oropharyngeal mucosa moist and pink , no lesions erythema or exudate. Lungs: Clear to auscultation bilaterally.  Non-labored. Heart: Regular rate and rhythm, no murmurs rubs or gallops.  Abdomen: Bowel sounds are normal, nontender, nondistended, no hepatosplenomegaly or masses, no abdominal bruits or hernia , no rebound or guarding.   Extremities: No lower extremity edema. No clubbing or deformities. Neuro: Alert and oriented x 3.  Grossly intact. Skin: Warm and dry, no jaundice.   Psych: Alert and cooperative, normal mood and affect.  Labs:    Imaging Studies: No results found.  Assessment and Plan:   Jeremy Hendrix is a 61 y.o. y/o male new-onset of constipation.  The patient had a colonoscopy approximately 2 years ago without any masses or lesions found.  The patient will be started on MiraLAX once a day to see if this helps his symptoms.  If it does not the patient has been told to start Citrucel in addition to his MiraLAX.  If the patient continues to have constipation after this he has been told to call my office and may need to be started on Amitiza versus Linzess.  The patient has no worrisome symptoms and a repeat colonoscopy is not recommended at the present time. The patient has been explained the plan and agrees with it.    Lucilla Lame, MD. [facility]   Note: This dictation was prepared with Dragon dictation along with smaller phrase technology. Any transcriptional errors that result from this process are unintentional.

## 2017-07-03 DIAGNOSIS — M1A9XX0 Chronic gout, unspecified, without tophus (tophi): Secondary | ICD-10-CM | POA: Diagnosis not present

## 2017-07-03 DIAGNOSIS — Z0000 Encounter for general adult medical examination without abnormal findings: Secondary | ICD-10-CM | POA: Diagnosis not present

## 2017-07-03 DIAGNOSIS — Z6841 Body Mass Index (BMI) 40.0 and over, adult: Secondary | ICD-10-CM | POA: Diagnosis not present

## 2017-07-03 DIAGNOSIS — M1389 Other specified arthritis, multiple sites: Secondary | ICD-10-CM | POA: Diagnosis not present

## 2017-07-03 DIAGNOSIS — I10 Essential (primary) hypertension: Secondary | ICD-10-CM | POA: Diagnosis not present

## 2017-07-03 DIAGNOSIS — E119 Type 2 diabetes mellitus without complications: Secondary | ICD-10-CM | POA: Diagnosis not present

## 2017-07-03 DIAGNOSIS — E785 Hyperlipidemia, unspecified: Secondary | ICD-10-CM | POA: Diagnosis not present

## 2017-07-03 DIAGNOSIS — J449 Chronic obstructive pulmonary disease, unspecified: Secondary | ICD-10-CM | POA: Diagnosis not present

## 2017-07-03 DIAGNOSIS — H9113 Presbycusis, bilateral: Secondary | ICD-10-CM | POA: Diagnosis not present

## 2017-07-18 ENCOUNTER — Other Ambulatory Visit: Payer: Self-pay | Admitting: Family Medicine

## 2017-07-18 MED ORDER — NEOMYCIN-POLYMYXIN-HC 3.5-10000-1 OT SOLN: OTIC | 1 refills | Status: DC

## 2017-07-18 NOTE — Telephone Encounter (Signed)
Pt wants a refill on his polymixin ear drops. Cortisporin solution  He uses [facility] on [address]   His call back is  [phone]  Thanks teri

## 2017-07-21 DIAGNOSIS — E119 Type 2 diabetes mellitus without complications: Secondary | ICD-10-CM | POA: Diagnosis not present

## 2017-07-21 DIAGNOSIS — I10 Essential (primary) hypertension: Secondary | ICD-10-CM | POA: Diagnosis not present

## 2017-07-31 DIAGNOSIS — E113393 Type 2 diabetes mellitus with moderate nonproliferative diabetic retinopathy without macular edema, bilateral: Secondary | ICD-10-CM | POA: Diagnosis not present

## 2017-08-10 DIAGNOSIS — G4733 Obstructive sleep apnea (adult) (pediatric): Secondary | ICD-10-CM | POA: Diagnosis not present

## 2017-08-10 DIAGNOSIS — Z6841 Body Mass Index (BMI) 40.0 and over, adult: Secondary | ICD-10-CM | POA: Diagnosis not present

## 2017-08-10 DIAGNOSIS — J449 Chronic obstructive pulmonary disease, unspecified: Secondary | ICD-10-CM | POA: Diagnosis not present

## 2017-08-10 DIAGNOSIS — E785 Hyperlipidemia, unspecified: Secondary | ICD-10-CM | POA: Diagnosis not present

## 2017-08-10 DIAGNOSIS — I10 Essential (primary) hypertension: Secondary | ICD-10-CM | POA: Diagnosis not present

## 2017-08-10 DIAGNOSIS — E119 Type 2 diabetes mellitus without complications: Secondary | ICD-10-CM | POA: Diagnosis not present

## 2017-08-14 ENCOUNTER — Other Ambulatory Visit: Payer: Self-pay | Admitting: *Deleted

## 2017-08-14 MED ORDER — LOSARTAN POTASSIUM-HCTZ 100-25 MG PO TABS: 1.0000 | ORAL_TABLET | Freq: Every day | ORAL | 3 refills | Status: DC

## 2017-08-14 NOTE — Telephone Encounter (Signed)
Requesting 90 day supply.

## 2017-08-16 DIAGNOSIS — J449 Chronic obstructive pulmonary disease, unspecified: Secondary | ICD-10-CM | POA: Diagnosis not present

## 2017-08-16 DIAGNOSIS — G4733 Obstructive sleep apnea (adult) (pediatric): Secondary | ICD-10-CM | POA: Diagnosis not present

## 2017-08-16 DIAGNOSIS — Z6841 Body Mass Index (BMI) 40.0 and over, adult: Secondary | ICD-10-CM | POA: Diagnosis not present

## 2017-08-16 DIAGNOSIS — R0609 Other forms of dyspnea: Secondary | ICD-10-CM | POA: Diagnosis not present

## 2017-08-22 ENCOUNTER — Other Ambulatory Visit: Payer: Self-pay | Admitting: Family Medicine

## 2017-08-22 NOTE — Telephone Encounter (Signed)
Pharmacy requesting refills. Thanks!  

## 2017-09-23 ENCOUNTER — Other Ambulatory Visit: Payer: Self-pay | Admitting: Family Medicine

## 2017-09-25 DIAGNOSIS — R69 Illness, unspecified: Secondary | ICD-10-CM | POA: Diagnosis not present

## 2017-09-25 NOTE — Telephone Encounter (Signed)
Pt contacted office for refill request on the following medications:  One Touch Ultra glucose blood test strip.  [place]  CB#[phone]/MW

## 2017-10-06 ENCOUNTER — Ambulatory Visit: Payer: Medicare HMO | Admitting: Family Medicine

## 2017-10-06 DIAGNOSIS — I10 Essential (primary) hypertension: Secondary | ICD-10-CM | POA: Diagnosis not present

## 2017-10-06 DIAGNOSIS — E119 Type 2 diabetes mellitus without complications: Secondary | ICD-10-CM | POA: Diagnosis not present

## 2017-10-09 ENCOUNTER — Encounter: Payer: Self-pay | Admitting: Family Medicine

## 2017-10-09 ENCOUNTER — Ambulatory Visit (INDEPENDENT_AMBULATORY_CARE_PROVIDER_SITE_OTHER): Payer: Medicare HMO | Admitting: Family Medicine

## 2017-10-09 VITALS — BP 132/98 | HR 104 | Temp 98.9°F | Resp 16 | Wt 386.0 lb

## 2017-10-09 DIAGNOSIS — Z23 Encounter for immunization: Secondary | ICD-10-CM

## 2017-10-09 DIAGNOSIS — B356 Tinea cruris: Secondary | ICD-10-CM | POA: Diagnosis not present

## 2017-10-09 DIAGNOSIS — H60541 Acute eczematoid otitis externa, right ear: Secondary | ICD-10-CM

## 2017-10-09 MED ORDER — KETOCONAZOLE 2 % EX CREA: 1.0000 "application " | TOPICAL_CREAM | Freq: Every day | CUTANEOUS | 2 refills | Status: DC

## 2017-10-09 MED ORDER — MOMETASONE FUROATE 0.1 % EX CREA: 1.0000 "application " | TOPICAL_CREAM | Freq: Every day | CUTANEOUS | 0 refills | Status: DC

## 2017-10-09 NOTE — Patient Instructions (Signed)
Do f/u with your endocrinologist and Dr. Caryn Section

## 2017-10-09 NOTE — Progress Notes (Signed)
Subjective:     Patient ID: ABUBAKR WIEMAN, male   DOB: [birth year], 61 y.o.   MRN: [id number]  HPI  Chief Complaint  Patient presents with  . Ear Pain    Patient reports itching and scaling of his right lower lobe and states that it is sore to the touch. Patient states that recent flare up was in [place] but reapperead 3 days ago and is concerned that he possibly has a yeast infection.   States symptoms resolved spontaneously without intervention. Also wishes refill on medication for chronic jock itch. Uses ear drops intermittently for ear discomfort.   Review of Systems     Objective:   Physical Exam  Constitutional: He appears well-developed and well-nourished. No distress.  HENT:  Ear canals patent. Small amount of white debris in right ear. Both T.M.'s intact.       Assessment:    1. Need for influenza vaccination - Flu Vaccine QUAD 36+ mos IM  2. Eczema of external ear, right - mometasone (ELOCON) 0.1 % cream; Apply 1 application topically daily. Apply to ear rash when occurs  Dispense: 30 g; Refill: 0  3. Tinea cruri - ketoconazole (NIZORAL) 2 % cream; Apply 1 application topically daily.  Dispense: 60 g; Refill: 2    Plan:    Encouraged f/u with Dr. Caryn Section and his endocrinologist.

## 2017-10-18 ENCOUNTER — Ambulatory Visit (INDEPENDENT_AMBULATORY_CARE_PROVIDER_SITE_OTHER): Payer: Medicare HMO | Admitting: Family Medicine

## 2017-10-18 ENCOUNTER — Encounter: Payer: Self-pay | Admitting: Family Medicine

## 2017-10-18 VITALS — BP 120/84 | HR 92 | Temp 98.0°F | Resp 16 | Wt 390.2 lb

## 2017-10-18 DIAGNOSIS — R5383 Other fatigue: Secondary | ICD-10-CM

## 2017-10-18 NOTE — Progress Notes (Signed)
Patient: Jeremy Hendrix Male    DOB: [birth year]   61 y.o.   MRN: [id number] Visit Date: 10/18/2017  Today's Provider: Lelon Huh, MD   No chief complaint on file.  Subjective:    HPI  Here to follow up regarding diet, has been on low carb diet for a few year, but still having trouble losing weight, although his blood sugars have been much better. He is concerned he may have problem with his metabolism such as low testosterone.  Lab Results  Component Value Date   HGBA1C 5.9 (H) 06/05/2017    Eczema of external ear, right From 10/09/2017-seen by Mariel Sleet. Started mometasone (ELOCON) 0.1 % cream.  Tinea cruri From 10/09/2017-seen by Mariel Sleet. Started ketoconazole (NIZORAL) 2 % cream. Advised to follow-up with pcp and endocrinology.   Wt Readings from Last 5 Encounters:  10/18/17 (!) 390 lb 3.2 oz (177 kg)  10/09/17 (!) 386 lb (175.1 kg)  06/27/17 (!) 389 lb 9.6 oz (176.7 kg)  06/05/17 (!) 395 lb (179.2 kg)  05/09/17 (!) 391 lb 9.6 oz (177.6 kg)       Allergies  Allergen Reactions  . Percocet [Oxycodone-Acetaminophen]      Current Outpatient Prescriptions:  .  albuterol (VENTOLIN HFA) 108 (90 Base) MCG/ACT inhaler, Inhale into the lungs., Disp: , Rfl:  .  allopurinol (ZYLOPRIM) 100 MG tablet, TAKE 1 TABLET BY MOUTH ONCE DAILY, Disp: 30 tablet, Rfl: 12 .  aspirin 325 MG tablet, Take 325 mg by mouth daily., Disp: , Rfl:  .  atorvastatin (LIPITOR) 20 MG tablet, Take 20 mg by mouth daily., Disp: , Rfl:  .  Cholecalciferol (VITAMIN D) 2000 UNITS CAPS, Take by mouth., Disp: , Rfl:  .  diltiazem (DILACOR XR) 180 MG 24 hr capsule, Take 240 mg by mouth. , Disp: , Rfl:  .  fluticasone (FLONASE) 50 MCG/ACT nasal spray, Place 2 sprays into both nostrils daily., Disp: 16 g, Rfl: 0 .  Fluticasone-Salmeterol (ADVAIR) 250-50 MCG/DOSE AEPB, Inhale 1 puff into the lungs 2 (two) times daily., Disp: , Rfl:  .  INCRUSE ELLIPTA 62.5 MCG/INH AEPB, , Disp: , Rfl:  .   ketoconazole (NIZORAL) 2 % cream, Apply 1 application topically daily., Disp: 60 g, Rfl: 2 .  LECITHIN PO, Take by mouth., Disp: , Rfl:  .  losartan-hydrochlorothiazide (HYZAAR) 100-25 MG tablet, Take 1 tablet by mouth daily., Disp: 90 tablet, Rfl: 3 .  metFORMIN (GLUCOPHAGE-XR) 500 MG 24 hr tablet, , Disp: , Rfl:  .  mometasone (ELOCON) 0.1 % cream, Apply 1 application topically daily. Apply to ear rash when occurs, Disp: 30 g, Rfl: 0 .  MULTIPLE VITAMINS-MINERALS PO, Take by mouth., Disp: , Rfl:  .  neomycin-polymyxin-hydrocortisone (CORTISPORIN) OTIC solution, INSTILL FOUR DROPS INTO THE AFFECTED EAR EVERY SIX HOURS AS NEEDED, Disp: 10 mL, Rfl: 1 .  Omega-3 Fatty Acids (FISH OIL) 1000 MG CAPS, Take by mouth., Disp: , Rfl:  .  ONE TOUCH ULTRA TEST test strip, USE ONE STRIP TO CHECK GLUCOSE ONCE DAILY, Disp: 100 each, Rfl: 7 .  saw palmetto 160 MG capsule, Take 160 mg by mouth 2 (two) times daily., Disp: , Rfl:  .  SPIRIVA HANDIHALER 18 MCG inhalation capsule, INHALE ONE DOSE BY MOUTH ONCE DAILY, Disp: 30 capsule, Rfl: 12 .  TAZTIA XT 240 MG 24 hr capsule, , Disp: , Rfl:  .  vitamin E 400 UNIT capsule, Take 400 Units by mouth daily., Disp: , Rfl:  Review of Systems  Constitutional: Negative for appetite change, chills and fever.  Respiratory: Negative for chest tightness, shortness of breath and wheezing.   Cardiovascular: Negative for chest pain and palpitations.  Gastrointestinal: Negative for abdominal pain, nausea and vomiting.    Social History  Substance Use Topics  . Smoking status: Former Smoker    Packs/day: 1.50    Years: 18.00    Types: Cigarettes  . Smokeless tobacco: Never Used     Comment: QUIT IN 2003  . Alcohol use No   Objective:   BP 120/84 (BP Location: Right Arm, Patient Position: Sitting, Cuff Size: Normal)   Pulse 92   Temp 98 F (36.7 C) (Oral)   Resp 16   Wt (!) 390 lb 3.2 oz (177 kg)   SpO2 93%   BMI 57.62 kg/m     Physical Exam  General  appearance: alert, well developed, well nourished, cooperative and in no distress, morbidly obese Head: Normocephalic, without obvious abnormality, atraumatic Respiratory: Respirations even and unlabored, normal respiratory rate Extremities: No gross deformities Skin: Skin color, texture, turgor normal. No rashes seen  Psych: Appropriate mood and affect. Neurologic: Mental status: Alert, oriented to person, place, and time, thought content appropriate.     Assessment & Plan:     1. Other fatigue  - Testosterone Total,Free,Bio, Males - TSH  2. Morbid obesity ([place]) He is interested in trying weight loss medication if labs are all normal.  - Testosterone Total,Free,Bio, Males - TSH       Lelon Huh, MD  [place]

## 2017-10-27 DIAGNOSIS — R5383 Other fatigue: Secondary | ICD-10-CM | POA: Diagnosis not present

## 2017-10-30 LAB — TESTOSTERONE TOTAL,FREE,BIO, MALES
Albumin: 4.1 g/dL (ref 3.6–5.1)
SEX HORMONE BINDING: 25 nmol/L (ref 22–77)
Testosterone: 204 ng/dL — ABNORMAL LOW (ref 250–827)

## 2017-10-30 LAB — TSH: TSH: 1.9 mIU/L (ref 0.40–4.50)

## 2017-10-31 ENCOUNTER — Telehealth: Payer: Self-pay

## 2017-10-31 DIAGNOSIS — E291 Testicular hypofunction: Secondary | ICD-10-CM

## 2017-10-31 NOTE — Telephone Encounter (Signed)
Order printed, please leave at front desk for patient to pick up

## 2017-10-31 NOTE — Telephone Encounter (Signed)
Advised patient of results. Patient reports that he is interested in starting testosterone replacement medications.

## 2017-10-31 NOTE — Telephone Encounter (Signed)
-----   Message from Birdie Sons, MD sent at 10/30/2017  3:34 PM EST ----- Testosterone level is slightly low at 204, normal is over 250. If he is interested in testosterone replacement medications, insurance requires measuring levels a second time to verify a low reading.

## 2017-11-03 DIAGNOSIS — E291 Testicular hypofunction: Secondary | ICD-10-CM | POA: Diagnosis not present

## 2017-11-06 ENCOUNTER — Telehealth: Payer: Self-pay | Admitting: Family Medicine

## 2017-11-06 DIAGNOSIS — E291 Testicular hypofunction: Secondary | ICD-10-CM

## 2017-11-06 LAB — PROLACTIN: Prolactin: 8 ng/mL (ref 2.0–18.0)

## 2017-11-06 LAB — TESTOSTERONE TOTAL,FREE,BIO, MALES
ALBUMIN MSPROF: 4.4 g/dL (ref 3.6–5.1)
Sex Hormone Binding: 27 nmol/L (ref 22–77)
Testosterone: 194 ng/dL — ABNORMAL LOW (ref 250–827)

## 2017-11-06 MED ORDER — TESTOSTERONE 4 MG/24HR TD PT24: 1.0000 | MEDICATED_PATCH | Freq: Every day | TRANSDERMAL | 1 refills | Status: DC

## 2017-11-07 ENCOUNTER — Encounter: Payer: Self-pay | Admitting: Family Medicine

## 2017-11-07 ENCOUNTER — Ambulatory Visit: Payer: Medicare HMO | Admitting: Family Medicine

## 2017-11-07 VITALS — BP 136/94 | HR 102 | Temp 97.7°F | Resp 20

## 2017-11-07 DIAGNOSIS — R350 Frequency of micturition: Secondary | ICD-10-CM

## 2017-11-07 DIAGNOSIS — N390 Urinary tract infection, site not specified: Secondary | ICD-10-CM

## 2017-11-07 LAB — POCT URINALYSIS DIPSTICK
Bilirubin, UA: NEGATIVE
Glucose, UA: NEGATIVE
Ketones, UA: NEGATIVE
LEUKOCYTES UA: NEGATIVE
NITRITE UA: NEGATIVE
PH UA: 6.5 (ref 5.0–8.0)
PROTEIN UA: NEGATIVE
Spec Grav, UA: 1.015 (ref 1.010–1.025)
UROBILINOGEN UA: 0.2 U/dL

## 2017-11-07 MED ORDER — CIPROFLOXACIN HCL 500 MG PO TABS: 500.0000 mg | ORAL_TABLET | Freq: Two times a day (BID) | ORAL | 0 refills | Status: DC

## 2017-11-07 NOTE — Progress Notes (Signed)
Patient: Jeremy Hendrix Male    DOB: [birth year]   61 y.o.   MRN: [id number] Visit Date: 11/07/2017  Today's Provider: Lelon Huh, MD   Chief Complaint  Patient presents with  . Urinary Frequency    x 3 days   Subjective:    Urinary Frequency   This is a new problem. Episode onset: 3 days ago. The problem has been gradually worsening. Associated symptoms include frequency. Pertinent negatives include no chills, nausea or vomiting.  Patient reports that he is only able to produce a small amount of urine.     Prostate Specific Ag, Serum  Date Value Ref Range Status  06/05/2017 2.2 0.0 - 4.0 ng/mL Final    Comment:    Roche ECLIA methodology. According to the American Urological Association, Serum PSA should decrease and remain at undetectable levels after radical prostatectomy. The AUA defines biochemical recurrence as an initial PSA value 0.2 ng/mL or greater followed by a subsequent confirmatory PSA value 0.2 ng/mL or greater. Values obtained with different assay methods or kits cannot be used interchangeably. Results cannot be interpreted as absolute evidence of the presence or absence of malignant disease.        Allergies  Allergen Reactions  . Percocet [Oxycodone-Acetaminophen]      Current Outpatient Medications:  .  albuterol (VENTOLIN HFA) 108 (90 Base) MCG/ACT inhaler, Inhale into the lungs., Disp: , Rfl:  .  allopurinol (ZYLOPRIM) 100 MG tablet, TAKE 1 TABLET BY MOUTH ONCE DAILY, Disp: 30 tablet, Rfl: 12 .  aspirin 325 MG tablet, Take 325 mg by mouth daily., Disp: , Rfl:  .  atorvastatin (LIPITOR) 20 MG tablet, Take 20 mg by mouth daily., Disp: , Rfl:  .  Cholecalciferol (VITAMIN D) 2000 UNITS CAPS, Take by mouth., Disp: , Rfl:  .  diltiazem (DILACOR XR) 180 MG 24 hr capsule, Take 240 mg by mouth. , Disp: , Rfl:  .  fluticasone (FLONASE) 50 MCG/ACT nasal spray, Place 2 sprays into both nostrils daily., Disp: 16 g, Rfl: 0 .   Fluticasone-Salmeterol (ADVAIR) 250-50 MCG/DOSE AEPB, Inhale 1 puff into the lungs 2 (two) times daily., Disp: , Rfl:  .  INCRUSE ELLIPTA 62.5 MCG/INH AEPB, , Disp: , Rfl:  .  ketoconazole (NIZORAL) 2 % cream, Apply 1 application topically daily., Disp: 60 g, Rfl: 2 .  LECITHIN PO, Take by mouth., Disp: , Rfl:  .  losartan-hydrochlorothiazide (HYZAAR) 100-25 MG tablet, Take 1 tablet by mouth daily., Disp: 90 tablet, Rfl: 3 .  metFORMIN (GLUCOPHAGE-XR) 500 MG 24 hr tablet, , Disp: , Rfl:  .  mometasone (ELOCON) 0.1 % cream, Apply 1 application topically daily. Apply to ear rash when occurs, Disp: 30 g, Rfl: 0 .  MULTIPLE VITAMINS-MINERALS PO, Take by mouth., Disp: , Rfl:  .  neomycin-polymyxin-hydrocortisone (CORTISPORIN) OTIC solution, INSTILL FOUR DROPS INTO THE AFFECTED EAR EVERY SIX HOURS AS NEEDED, Disp: 10 mL, Rfl: 1 .  Omega-3 Fatty Acids (FISH OIL) 1000 MG CAPS, Take by mouth., Disp: , Rfl:  .  ONE TOUCH ULTRA TEST test strip, USE ONE STRIP TO CHECK GLUCOSE ONCE DAILY, Disp: 100 each, Rfl: 7 .  saw palmetto 160 MG capsule, Take 160 mg by mouth 2 (two) times daily., Disp: , Rfl:  .  SPIRIVA HANDIHALER 18 MCG inhalation capsule, INHALE ONE DOSE BY MOUTH ONCE DAILY, Disp: 30 capsule, Rfl: 12 .  TAZTIA XT 240 MG 24 hr capsule, , Disp: , Rfl:  .  testosterone (ANDRODERM) 4 MG/24HR PT24 patch, Place 1 patch daily onto the skin., Disp: 30 patch, Rfl: 1 .  vitamin E 400 UNIT capsule, Take 400 Units by mouth daily., Disp: , Rfl:   Review of Systems  Constitutional: Negative for appetite change, chills and fever.  Respiratory: Negative for chest tightness, shortness of breath and wheezing.   Cardiovascular: Negative for chest pain and palpitations.  Gastrointestinal: Negative for abdominal pain, nausea and vomiting.  Genitourinary: Positive for frequency.    Social History   Tobacco Use  . Smoking status: Former Smoker    Packs/day: 1.50    Years: 18.00    Pack years: 27.00    Types:  Cigarettes  . Smokeless tobacco: Never Used  . Tobacco comment: QUIT IN 2003  Substance Use Topics  . Alcohol use: No    Alcohol/week: 0.0 oz   Objective:   BP (!) 136/94 (BP Location: Left Arm, Patient Position: Sitting, Cuff Size: Large)   Pulse (!) 102   Temp 97.7 F (36.5 C) (Oral)   Resp 20   SpO2 96% Comment: room air There were no vitals filed for this visit.   Physical Exam  General appearance: alert, well developed, well nourished, cooperative and in no distress Head: Normocephalic, without obvious abnormality, atraumatic Respiratory: Respirations even and unlabored, normal respiratory rate Extremities: No gross deformities Skin: Skin color, texture, turgor normal. No rashes seen  Psych: Appropriate mood and affect. Neurologic: Mental status: Alert, oriented to person, place, and time, thought content appropriate.  Results for orders placed or performed in visit on 11/07/17  POCT Urinalysis Dipstick  Result Value Ref Range   Color, UA yellow    Clarity, UA clear    Glucose, UA negative    Bilirubin, UA negative    Ketones, UA negative    Spec Grav, UA 1.015 1.010 - 1.025   Blood, UA Trace (Non-hemolyzed)    pH, UA 6.5 5.0 - 8.0   Protein, UA negative    Urobilinogen, UA 0.2 0.2 or 1.0 E.U./dL   Nitrite, UA negative    Leukocytes, UA Negative Negative       Assessment & Plan:     1. Urinary frequency  - POCT Urinalysis Dipstick - Urine Culture; Future - Urine Culture  2. Urinary tract infection without hematuria, site unspecified Suspect prostatitis versus cystitis. Start ciprofloxacin while awaiting culture results. - ciprofloxacin (CIPRO) 500 MG tablet; Take 1 tablet (500 mg total) by mouth 2 (two) times daily for 7 days.  Dispense: 14 tablet; Refill: 0     The entirety of the information documented in the History of Present Illness, Review of Systems and Physical Exam were personally obtained by me. Portions of this information were initially  documented by Meyer Cory, CMA and reviewed by me for thoroughness and accuracy.    Lelon Huh, MD  [facility]

## 2017-11-08 LAB — URINE CULTURE
MICRO NUMBER:: 81308967
RESULT: NO GROWTH
SPECIMEN QUALITY:: ADEQUATE

## 2017-11-11 DIAGNOSIS — R69 Illness, unspecified: Secondary | ICD-10-CM | POA: Diagnosis not present

## 2017-11-13 ENCOUNTER — Telehealth: Payer: Self-pay | Admitting: *Deleted

## 2017-11-13 ENCOUNTER — Telehealth: Payer: Self-pay | Admitting: Family Medicine

## 2017-11-13 DIAGNOSIS — N390 Urinary tract infection, site not specified: Secondary | ICD-10-CM

## 2017-11-13 MED ORDER — CIPROFLOXACIN HCL 500 MG PO TABS: 500.0000 mg | ORAL_TABLET | Freq: Two times a day (BID) | ORAL | 0 refills | Status: AC

## 2017-11-13 NOTE — Telephone Encounter (Signed)
Please advise 

## 2017-11-13 NOTE — Telephone Encounter (Signed)
Patient was notified of results. Expressed understanding. Rx sent to pharmacy. 

## 2017-11-13 NOTE — Telephone Encounter (Signed)
-----   Message from Birdie Sons, MD sent at 11/09/2017 12:37 PM EST ----- Urine culture is negative. He likely as prostate infection. Need to stay on Cipro for total 3 weeks. Please send refill ciprofloxacin 500mg  twice a day for 14 days, #28.

## 2017-11-13 NOTE — Telephone Encounter (Signed)
Pt is requesting a referral to see a Urologist.  CB#[phone]/MW

## 2017-11-16 DIAGNOSIS — E785 Hyperlipidemia, unspecified: Secondary | ICD-10-CM | POA: Diagnosis not present

## 2017-11-16 DIAGNOSIS — I10 Essential (primary) hypertension: Secondary | ICD-10-CM | POA: Diagnosis not present

## 2017-11-16 DIAGNOSIS — G4733 Obstructive sleep apnea (adult) (pediatric): Secondary | ICD-10-CM | POA: Diagnosis not present

## 2017-11-16 DIAGNOSIS — E119 Type 2 diabetes mellitus without complications: Secondary | ICD-10-CM | POA: Diagnosis not present

## 2017-11-16 DIAGNOSIS — J449 Chronic obstructive pulmonary disease, unspecified: Secondary | ICD-10-CM | POA: Diagnosis not present

## 2017-11-23 NOTE — Telephone Encounter (Signed)
error 

## 2017-11-30 ENCOUNTER — Telehealth: Payer: Self-pay | Admitting: Family Medicine

## 2017-11-30 NOTE — Telephone Encounter (Signed)
Please advise 

## 2017-11-30 NOTE — Telephone Encounter (Signed)
Pt stated that after the medication testosterone (ANDRODERM) 4 MG/24HR PT24 patch was approved by insurance the pharmacy advised it is on back order and they don't know when they will get the medication. Pt is requesting an alternative medication be sent to [facility] until the testosterone (ANDRODERM) 4 MG/24HR PT24 patch is available. Please advise. Thanks TNP

## 2017-12-04 ENCOUNTER — Telehealth: Payer: Self-pay | Admitting: Family Medicine

## 2017-12-04 DIAGNOSIS — Z201 Contact with and (suspected) exposure to tuberculosis: Secondary | ICD-10-CM

## 2017-12-04 NOTE — Telephone Encounter (Signed)
Not indicated unless he has symptoms or a prolonged  exposure to someone who is known to have Tb.

## 2017-12-04 NOTE — Telephone Encounter (Signed)
Pt stated that he was exposed to someone that is being tested for TB. Pt is requesting to get tested as well. Pt denies cough or fever. Please advise. Thanks TNP

## 2017-12-04 NOTE — Telephone Encounter (Signed)
Please advise 

## 2017-12-06 NOTE — Telephone Encounter (Signed)
Order printed, please leave a front desk.

## 2017-12-06 NOTE — Telephone Encounter (Signed)
Patient reports that he recently took in a homeless [place] vet and reports that he is positive that he has TB. Patient reports that he has COPD and he does not want it to become worse with this exposure. He reports that he has a cough, but it is no different from his baseline. Do you still think that he does not need to be tested? Please advise. Thanks!

## 2017-12-06 NOTE — Telephone Encounter (Signed)
Done

## 2017-12-08 ENCOUNTER — Ambulatory Visit: Payer: Medicare HMO | Admitting: Urology

## 2017-12-13 ENCOUNTER — Other Ambulatory Visit: Payer: Self-pay | Admitting: Family Medicine

## 2017-12-13 DIAGNOSIS — E291 Testicular hypofunction: Secondary | ICD-10-CM | POA: Diagnosis not present

## 2017-12-13 DIAGNOSIS — Z201 Contact with and (suspected) exposure to tuberculosis: Secondary | ICD-10-CM | POA: Diagnosis not present

## 2017-12-14 LAB — TESTOSTERONE TOTAL,FREE,BIO, MALES
Albumin: 4.3 g/dL (ref 3.6–5.1)
SEX HORMONE BINDING: 24 nmol/L (ref 22–77)
Testosterone: 179 ng/dL — ABNORMAL LOW (ref 250–827)

## 2017-12-14 LAB — PROLACTIN: Prolactin: 8.3 ng/mL (ref 2.0–18.0)

## 2017-12-15 LAB — QUANTIFERON-TB GOLD PLUS
NIL: 0.02 IU/mL
QUANTIFERON-TB GOLD PLUS: NEGATIVE
TB1-NIL: 0.01 [IU]/mL
TB2-NIL: 0 IU/mL

## 2017-12-18 NOTE — Telephone Encounter (Signed)
-----   Message from Birdie Sons, MD sent at 12/15/2017 12:33 PM EST ----- Testosterone level is still low, need to increase androderm to 2 patches daily, #60, rf x 0. Follow up office visit 4 weeks.

## 2017-12-18 NOTE — Telephone Encounter (Signed)
Patient was advised. Patient states that androderm patches is on backorder at his pharmacy and he would like to try a different medication. Please advise?

## 2017-12-21 MED ORDER — TESTOSTERONE 50 MG/5GM (1%) TD GEL: 10.0000 g | Freq: Every day | TRANSDERMAL | 3 refills | Status: DC

## 2017-12-29 DIAGNOSIS — I10 Essential (primary) hypertension: Secondary | ICD-10-CM | POA: Diagnosis not present

## 2017-12-29 DIAGNOSIS — E119 Type 2 diabetes mellitus without complications: Secondary | ICD-10-CM | POA: Diagnosis not present

## 2018-01-03 ENCOUNTER — Encounter: Payer: Self-pay | Admitting: Family Medicine

## 2018-01-05 ENCOUNTER — Ambulatory Visit: Payer: Medicare HMO | Admitting: Urology

## 2018-01-15 ENCOUNTER — Telehealth: Payer: Self-pay | Admitting: Family Medicine

## 2018-01-15 NOTE — Telephone Encounter (Signed)
Pt is requesting a CMA or Dr. Caryn Section return his call to discuss some side effects he believes he is having from testosterone (TESTIM) 50 MG/5GM (1%) GEL. Pt stated that rather going over everything with me to put in the message he would rather speak with a CMA or Dr. Caryn Section himself, someone that is competent that he can discuss this with. Please advise. Thanks TNP

## 2018-01-22 DIAGNOSIS — E119 Type 2 diabetes mellitus without complications: Secondary | ICD-10-CM | POA: Diagnosis not present

## 2018-01-22 DIAGNOSIS — I10 Essential (primary) hypertension: Secondary | ICD-10-CM | POA: Diagnosis not present

## 2018-01-22 DIAGNOSIS — E785 Hyperlipidemia, unspecified: Secondary | ICD-10-CM | POA: Diagnosis not present

## 2018-01-23 NOTE — Telephone Encounter (Signed)
Per Dr Caryn Section via my chart message:   Supplemental testosterone can cause enlargement of the prostate gland and can also cause mood changes including aggression. Prostate problems can be managed by taking additional medications that make it easier to urinate, but then you are taking one drug just to counteract the side effects of another, which increases the chance of more drug interactions. In these situations, an evaluation by a urologist is recommended, so I think it would be a good idea to keep your appointment with Dr. Sharin Grave.    Last read by Jeremy Hendrix at 6:54 PM on 6/50/3546.

## 2018-01-26 ENCOUNTER — Encounter: Payer: Self-pay | Admitting: Urology

## 2018-01-26 ENCOUNTER — Ambulatory Visit: Payer: Medicare HMO | Admitting: Urology

## 2018-01-26 VITALS — BP 133/87 | HR 106 | Ht 69.0 in | Wt 386.9 lb

## 2018-01-26 DIAGNOSIS — N400 Enlarged prostate without lower urinary tract symptoms: Secondary | ICD-10-CM

## 2018-01-26 DIAGNOSIS — R3129 Other microscopic hematuria: Secondary | ICD-10-CM | POA: Diagnosis not present

## 2018-01-26 LAB — URINALYSIS, COMPLETE
Bilirubin, UA: NEGATIVE
GLUCOSE, UA: NEGATIVE
KETONES UA: NEGATIVE
Leukocytes, UA: NEGATIVE
NITRITE UA: NEGATIVE
Protein, UA: NEGATIVE
Specific Gravity, UA: 1.02 (ref 1.005–1.030)
UUROB: 0.2 mg/dL (ref 0.2–1.0)
pH, UA: 6 (ref 5.0–7.5)

## 2018-01-26 LAB — MICROSCOPIC EXAMINATION
Bacteria, UA: NONE SEEN
WBC UA: NONE SEEN /HPF (ref 0–?)

## 2018-01-26 MED ORDER — TAMSULOSIN HCL 0.4 MG PO CAPS: 0.4000 mg | ORAL_CAPSULE | Freq: Every day | ORAL | 11 refills | Status: DC

## 2018-01-26 NOTE — Progress Notes (Signed)
01/26/2018 2:21 PM   Jeremy Hendrix [birth year] [id number]  Referring provider: Birdie Sons, [place], [place] [address]  Chief Complaint  Patient presents with  . Hematuria    HPI: The patient is a 62 year old gentleman presents today to discuss lower urinary tract symptoms.  He was treated for prostatitis in November 2018 with a course of ciprofloxacin.  He has had 1-2 urinary tract infections in the last year.  At this point, his concerns are urinary urgency with dribbling occasionally.  He also gets up 1-3 times to urinate at night.  He feels he has a weak stream.  He is "disturbed" by this.  He is interested in trying medication for this.  Of note, the patient has been trying to lose weight for many years. He also was noted to have fatigue.  He was started on testosterone recently due to testosterone level in the approximately high 100 to low 200 range.  He feels that since being on this medication he has increased aggression.  He does also note erectile dysfunction for the last 10 years. Of note, the patient has a weight of 387 pounds and a BMI of 57.  PMH: Past Medical History:  Diagnosis Date  . Gout   . Lateral epicondylitis of right elbow   . Pruritus   . Seborrheic dermatitis     Surgical History: Past Surgical History:  Procedure Laterality Date  . CATARACT EXTRACTION, BILATERAL    . COLONOSCOPY WITH PROPOFOL N/A 07/21/2015   Procedure: COLONOSCOPY WITH PROPOFOL;  Surgeon: Lucilla Lame, MD;  Location: [facility] ENDOSCOPY;  Service: Endoscopy;  Laterality: N/A;  . HIP SURGERY      Home Medications:  Allergies as of 01/26/2018      Reactions   Percocet [oxycodone-acetaminophen]       Medication List        Accurate as of 01/26/18  2:21 PM. Always use your most recent med list.          allopurinol 100 MG tablet Commonly known as:  ZYLOPRIM TAKE 1 TABLET BY MOUTH ONCE DAILY   aspirin 325 MG tablet Take 325 mg by mouth daily.     atorvastatin 20 MG tablet Commonly known as:  LIPITOR Take 20 mg by mouth daily.   diltiazem 180 MG 24 hr capsule Commonly known as:  DILACOR XR Take 240 mg by mouth.   Fish Oil 1000 MG Caps Take by mouth.   fluticasone 50 MCG/ACT nasal spray Commonly known as:  FLONASE Place 2 sprays into both nostrils daily.   Fluticasone-Salmeterol 250-50 MCG/DOSE Aepb Commonly known as:  ADVAIR Inhale 1 puff into the lungs 2 (two) times daily.   INCRUSE ELLIPTA 62.5 MCG/INH Aepb Generic drug:  umeclidinium bromide   ketoconazole 2 % cream Commonly known as:  NIZORAL Apply 1 application topically daily.   LECITHIN PO Take by mouth.   losartan-hydrochlorothiazide 100-25 MG tablet Commonly known as:  HYZAAR Take 1 tablet by mouth daily.   metFORMIN 500 MG 24 hr tablet Commonly known as:  GLUCOPHAGE-XR   mometasone 0.1 % cream Commonly known as:  ELOCON Apply 1 application topically daily. Apply to ear rash when occurs   MULTIPLE VITAMINS-MINERALS PO Take by mouth.   neomycin-polymyxin-hydrocortisone OTIC solution Commonly known as:  CORTISPORIN INSTILL FOUR DROPS INTO THE AFFECTED EAR EVERY SIX HOURS AS NEEDED   ONE TOUCH ULTRA TEST test strip Generic drug:  glucose blood USE ONE STRIP TO CHECK GLUCOSE ONCE  DAILY   saw palmetto 160 MG capsule Take 160 mg by mouth 2 (two) times daily.   tamsulosin 0.4 MG Caps capsule Commonly known as:  FLOMAX Take 1 capsule (0.4 mg total) by mouth daily.   TAZTIA XT 240 MG 24 hr capsule Generic drug:  diltiazem   testosterone 50 MG/5GM (1%) Gel Commonly known as:  TESTIM Place 10 g onto the skin daily.   VENTOLIN HFA 108 (90 Base) MCG/ACT inhaler Generic drug:  albuterol Inhale into the lungs.   Vitamin D 2000 units Caps Take by mouth.   vitamin E 400 UNIT capsule Take 400 Units by mouth daily.       Allergies:  Allergies  Allergen Reactions  . Percocet [Oxycodone-Acetaminophen]     Family History: Family History   Problem Relation Age of Onset  . Myasthenia gravis Mother   . Heart disease Father   . Healthy Sister   . Prostate cancer Neg Hx   . Bladder Cancer Neg Hx   . Kidney cancer Neg Hx     Social History:  reports that he has quit smoking. His smoking use included cigarettes. He has a 27.00 pack-year smoking history. he has never used smokeless tobacco. He reports that he does not drink alcohol or use drugs.  ROS: UROLOGY Frequent Urination?: Yes Hard to postpone urination?: Yes Burning/pain with urination?: No Get up at night to urinate?: Yes Leakage of urine?: Yes Urine stream starts and stops?: No Trouble starting stream?: No Do you have to strain to urinate?: No Blood in urine?: No Urinary tract infection?: No Sexually transmitted disease?: No Injury to kidneys or bladder?: No Painful intercourse?: No Weak stream?: No Erection problems?: Yes Penile pain?: No  Gastrointestinal Nausea?: No Vomiting?: No Indigestion/heartburn?: No Diarrhea?: No Constipation?: No  Constitutional Fever: No Night sweats?: No Weight loss?: No Fatigue?: Yes  Skin Skin rash/lesions?: No Itching?: No  Eyes Blurred vision?: No Double vision?: No  Ears/Nose/Throat Sore throat?: No Sinus problems?: No  Hematologic/Lymphatic Swollen glands?: No Easy bruising?: No  Cardiovascular Leg swelling?: No Chest pain?: No  Respiratory Cough?: Yes Shortness of breath?: Yes  Endocrine Excessive thirst?: No  Musculoskeletal Back pain?: Yes Joint pain?: Yes  Neurological Headaches?: No Dizziness?: No  Psychologic Depression?: No Anxiety?: No  Physical Exam: BP 133/87 (BP Location: Right Arm, Patient Position: Sitting, Cuff Size: Large)   Pulse (!) 106   Ht 5\' 9"  (1.753 m)   Wt (!) 386 lb 14.4 oz (175.5 kg)   BMI 57.14 kg/m   Constitutional:  Alert and oriented, No acute distress. HEENT: [place] AT, moist mucus membranes.  Trachea midline, no masses. Cardiovascular: No  clubbing, cyanosis, or edema. Respiratory: Normal respiratory effort, no increased work of breathing. GI: Abdomen is soft, nontender, nondistended, no abdominal masses GU: No CVA tenderness.  Skin: No rashes, bruises or suspicious lesions. Lymph: No cervical or inguinal adenopathy. Neurologic: Grossly intact, no focal deficits, moving all 4 extremities. Psychiatric: Normal mood and affect.  Laboratory Data: Lab Results  Component Value Date   WBC 10.0 06/05/2017   HGB 16.5 06/05/2017   HCT 49.1 06/05/2017   MCV 93 06/05/2017   PLT 248 06/05/2017    Lab Results  Component Value Date   CREATININE 1.05 06/05/2017    No results found for: PSA  Lab Results  Component Value Date   TESTOSTERONE 179 (L) 12/13/2017    Lab Results  Component Value Date   HGBA1C 5.9 (H) 06/05/2017    Urinalysis  Component Value Date/Time   BILIRUBINUR negative 11/07/2017 1350   PROTEINUR negative 11/07/2017 1350   UROBILINOGEN 0.2 11/07/2017 1350   NITRITE negative 11/07/2017 1350   LEUKOCYTESUR Negative 11/07/2017 1350    Assessment & Plan:    1.  BPH We will start the patient on Flomax for his mixture of irritative and obstructive voiding symptoms.  If he continues to have irritative symptoms at his next visit, we can try Myrbetriq  2.  Hypogonadism I did discuss with the patient the risks and benefits of being on testosterone.  I did discuss that I strongly feel that the risks outweigh the benefits of this medication due to his morbid obesity.  We discussed the risk of heart attack, blood clots, and stroke.  We also discussed that he is at higher risk for this given his obesity status.  I have strongly recommended that he stop this medication.  I reassured him that this is not the reason for lack of ability to lose weight.  Return in about 3 months (around 04/25/2018).  Nickie Retort, MD  [facility] [address] [place], [place]  [address] [phone]

## 2018-02-09 DIAGNOSIS — R635 Abnormal weight gain: Secondary | ICD-10-CM | POA: Diagnosis not present

## 2018-02-15 DIAGNOSIS — E785 Hyperlipidemia, unspecified: Secondary | ICD-10-CM | POA: Diagnosis not present

## 2018-02-15 DIAGNOSIS — J449 Chronic obstructive pulmonary disease, unspecified: Secondary | ICD-10-CM | POA: Diagnosis not present

## 2018-02-15 DIAGNOSIS — N400 Enlarged prostate without lower urinary tract symptoms: Secondary | ICD-10-CM | POA: Diagnosis not present

## 2018-02-15 DIAGNOSIS — G4733 Obstructive sleep apnea (adult) (pediatric): Secondary | ICD-10-CM | POA: Diagnosis not present

## 2018-02-15 DIAGNOSIS — I10 Essential (primary) hypertension: Secondary | ICD-10-CM | POA: Diagnosis not present

## 2018-02-15 DIAGNOSIS — E119 Type 2 diabetes mellitus without complications: Secondary | ICD-10-CM | POA: Diagnosis not present

## 2018-03-07 ENCOUNTER — Telehealth: Payer: Self-pay | Admitting: Family Medicine

## 2018-03-07 NOTE — Telephone Encounter (Signed)
[facility] on [address]. Is out of Losartan 100-25 mg.  And is not sure when it will be available.   Can you call something else in for him to replace this.

## 2018-03-07 NOTE — Telephone Encounter (Signed)
Please advise 

## 2018-03-08 MED ORDER — LISINOPRIL-HYDROCHLOROTHIAZIDE 20-25 MG PO TABS: 1.0000 | ORAL_TABLET | Freq: Every day | ORAL | 3 refills | Status: DC

## 2018-03-08 NOTE — Telephone Encounter (Signed)
Notified patient when he called in to check on this.

## 2018-03-08 NOTE — Telephone Encounter (Signed)
Have sent prescription lisinopril-hctz to [facility].

## 2018-04-20 DIAGNOSIS — M109 Gout, unspecified: Secondary | ICD-10-CM | POA: Diagnosis not present

## 2018-04-20 DIAGNOSIS — Z6841 Body Mass Index (BMI) 40.0 and over, adult: Secondary | ICD-10-CM | POA: Diagnosis not present

## 2018-04-20 DIAGNOSIS — E11620 Type 2 diabetes mellitus with diabetic dermatitis: Secondary | ICD-10-CM | POA: Diagnosis not present

## 2018-04-20 DIAGNOSIS — I10 Essential (primary) hypertension: Secondary | ICD-10-CM | POA: Diagnosis not present

## 2018-04-20 DIAGNOSIS — I4891 Unspecified atrial fibrillation: Secondary | ICD-10-CM | POA: Diagnosis not present

## 2018-04-20 DIAGNOSIS — J449 Chronic obstructive pulmonary disease, unspecified: Secondary | ICD-10-CM | POA: Diagnosis not present

## 2018-04-20 DIAGNOSIS — E1165 Type 2 diabetes mellitus with hyperglycemia: Secondary | ICD-10-CM | POA: Diagnosis not present

## 2018-04-20 DIAGNOSIS — K5900 Constipation, unspecified: Secondary | ICD-10-CM | POA: Diagnosis not present

## 2018-04-20 DIAGNOSIS — E785 Hyperlipidemia, unspecified: Secondary | ICD-10-CM | POA: Diagnosis not present

## 2018-04-25 DIAGNOSIS — E119 Type 2 diabetes mellitus without complications: Secondary | ICD-10-CM | POA: Diagnosis not present

## 2018-04-27 ENCOUNTER — Ambulatory Visit: Payer: Medicare HMO

## 2018-04-27 DIAGNOSIS — I10 Essential (primary) hypertension: Secondary | ICD-10-CM | POA: Diagnosis not present

## 2018-04-27 DIAGNOSIS — E119 Type 2 diabetes mellitus without complications: Secondary | ICD-10-CM | POA: Diagnosis not present

## 2018-04-27 DIAGNOSIS — R635 Abnormal weight gain: Secondary | ICD-10-CM | POA: Diagnosis not present

## 2018-05-02 DIAGNOSIS — E113393 Type 2 diabetes mellitus with moderate nonproliferative diabetic retinopathy without macular edema, bilateral: Secondary | ICD-10-CM | POA: Diagnosis not present

## 2018-05-02 LAB — HM DIABETES EYE EXAM

## 2018-05-10 ENCOUNTER — Other Ambulatory Visit: Payer: Self-pay | Admitting: Family Medicine

## 2018-05-10 MED ORDER — FLUTICASONE-SALMETEROL 250-50 MCG/DOSE IN AEPB: 1.0000 | INHALATION_SPRAY | Freq: Two times a day (BID) | RESPIRATORY_TRACT | 4 refills | Status: DC

## 2018-05-10 MED ORDER — LISINOPRIL-HYDROCHLOROTHIAZIDE 20-25 MG PO TABS: 1.0000 | ORAL_TABLET | Freq: Every day | ORAL | 4 refills | Status: DC

## 2018-05-10 MED ORDER — ALLOPURINOL 100 MG PO TABS: 100.0000 mg | ORAL_TABLET | Freq: Every day | ORAL | 4 refills | Status: DC

## 2018-05-10 NOTE — Telephone Encounter (Signed)
Pt needs refill on his   Lisinopril 20-25  Allopurinol 100 mg  Advair 250-50 MCG   Has also sent fax for these prescriptions   Con [place]os

## 2018-05-17 DIAGNOSIS — E785 Hyperlipidemia, unspecified: Secondary | ICD-10-CM | POA: Diagnosis not present

## 2018-05-17 DIAGNOSIS — I10 Essential (primary) hypertension: Secondary | ICD-10-CM | POA: Diagnosis not present

## 2018-05-17 DIAGNOSIS — J449 Chronic obstructive pulmonary disease, unspecified: Secondary | ICD-10-CM | POA: Diagnosis not present

## 2018-05-17 DIAGNOSIS — E119 Type 2 diabetes mellitus without complications: Secondary | ICD-10-CM | POA: Diagnosis not present

## 2018-05-17 DIAGNOSIS — G4733 Obstructive sleep apnea (adult) (pediatric): Secondary | ICD-10-CM | POA: Diagnosis not present

## 2018-05-17 DIAGNOSIS — N400 Enlarged prostate without lower urinary tract symptoms: Secondary | ICD-10-CM | POA: Diagnosis not present

## 2018-05-18 ENCOUNTER — Ambulatory Visit: Payer: Medicare HMO

## 2018-05-22 ENCOUNTER — Encounter: Payer: Self-pay | Admitting: Family Medicine

## 2018-05-31 ENCOUNTER — Encounter: Payer: Self-pay | Admitting: Urology

## 2018-05-31 ENCOUNTER — Ambulatory Visit: Payer: Medicare HMO

## 2018-05-31 ENCOUNTER — Ambulatory Visit (INDEPENDENT_AMBULATORY_CARE_PROVIDER_SITE_OTHER): Payer: Medicare HMO | Admitting: Urology

## 2018-05-31 VITALS — BP 125/63 | HR 105 | Ht 69.0 in | Wt 375.4 lb

## 2018-05-31 DIAGNOSIS — N400 Enlarged prostate without lower urinary tract symptoms: Secondary | ICD-10-CM

## 2018-05-31 DIAGNOSIS — R350 Frequency of micturition: Secondary | ICD-10-CM | POA: Diagnosis not present

## 2018-05-31 DIAGNOSIS — R7989 Other specified abnormal findings of blood chemistry: Secondary | ICD-10-CM

## 2018-05-31 LAB — MICROSCOPIC EXAMINATION

## 2018-05-31 LAB — URINALYSIS, COMPLETE
Bilirubin, UA: NEGATIVE
Glucose, UA: NEGATIVE
Ketones, UA: NEGATIVE
Leukocytes, UA: NEGATIVE
NITRITE UA: NEGATIVE
PH UA: 5.5 (ref 5.0–7.5)
Protein, UA: NEGATIVE
RBC UA: NEGATIVE
Specific Gravity, UA: 1.02 (ref 1.005–1.030)
UUROB: 0.2 mg/dL (ref 0.2–1.0)

## 2018-05-31 NOTE — Progress Notes (Signed)
05/31/2018 3:11 PM   Jeremy Hendrix [birth year] [id number]  Referring provider: Birdie Sons, [place], [place] [address]  No chief complaint on file.   HPI:  F/u -   LUTS - seen Feb 2018 for urinary urgency with dribbling occasionally.  He also gets up 1-3 times to urinate at night.  He feels he has a weak stream. His PSA was 2.2  He is "disturbed" by this. He was treated with Cipro for UTI or prostatitis. He started tamsulosin. PMH sig for fatigue, low T (T 100-200 range), obesity (BMI 57) and ED (since 2008).   He returns and tamsulosin has improved voiding somewhat. His PSA was 2.2 last year. He is happy. No FH PCa.     PMH: Past Medical History:  Diagnosis Date  . Gout   . Lateral epicondylitis of right elbow   . Pruritus   . Seborrheic dermatitis     Surgical History: Past Surgical History:  Procedure Laterality Date  . CATARACT EXTRACTION, BILATERAL    . COLONOSCOPY WITH PROPOFOL N/A 07/21/2015   Procedure: COLONOSCOPY WITH PROPOFOL;  Surgeon: Lucilla Lame, MD;  Location: [facility] ENDOSCOPY;  Service: Endoscopy;  Laterality: N/A;  . HIP SURGERY      Home Medications:  Allergies as of 05/31/2018      Reactions   Percocet [oxycodone-acetaminophen]       Medication List        Accurate as of 05/31/18  3:11 PM. Always use your most recent med list.          allopurinol 100 MG tablet Commonly known as:  ZYLOPRIM Take 1 tablet (100 mg total) by mouth daily.   aspirin 325 MG tablet Take 325 mg by mouth daily.   atorvastatin 20 MG tablet Commonly known as:  LIPITOR Take 20 mg by mouth daily.   diltiazem 180 MG 24 hr capsule Commonly known as:  DILACOR XR Take 240 mg by mouth.   Fish Oil 1000 MG Caps Take by mouth.   fluticasone 50 MCG/ACT nasal spray Commonly known as:  FLONASE Place 2 sprays into both nostrils daily.   Fluticasone-Salmeterol 250-50 MCG/DOSE Aepb Commonly known as:  ADVAIR Inhale 1 puff into the  lungs 2 (two) times daily.   INCRUSE ELLIPTA 62.5 MCG/INH Aepb Generic drug:  umeclidinium bromide   ketoconazole 2 % cream Commonly known as:  NIZORAL Apply 1 application topically daily.   LECITHIN PO Take by mouth.   lisinopril-hydrochlorothiazide 20-25 MG tablet Commonly known as:  PRINZIDE,ZESTORETIC Take 1 tablet by mouth daily.   metFORMIN 500 MG 24 hr tablet Commonly known as:  GLUCOPHAGE-XR   mometasone 0.1 % cream Commonly known as:  ELOCON Apply 1 application topically daily. Apply to ear rash when occurs   MULTIPLE VITAMINS-MINERALS PO Take by mouth.   neomycin-polymyxin-hydrocortisone OTIC solution Commonly known as:  CORTISPORIN INSTILL FOUR DROPS INTO THE AFFECTED EAR EVERY SIX HOURS AS NEEDED   ONE TOUCH ULTRA TEST test strip Generic drug:  glucose blood USE ONE STRIP TO CHECK GLUCOSE ONCE DAILY   saw palmetto 160 MG capsule Take 160 mg by mouth 2 (two) times daily.   tamsulosin 0.4 MG Caps capsule Commonly known as:  FLOMAX Take 1 capsule (0.4 mg total) by mouth daily.   TAZTIA XT 240 MG 24 hr capsule Generic drug:  diltiazem   testosterone 50 MG/5GM (1%) Gel Commonly known as:  TESTIM Place 10 g onto the skin daily.   VENTOLIN  HFA 108 (90 Base) MCG/ACT inhaler Generic drug:  albuterol Inhale into the lungs.   Vitamin D 2000 units Caps Take by mouth.   vitamin E 400 UNIT capsule Take 400 Units by mouth daily.       Allergies:  Allergies  Allergen Reactions  . Percocet [Oxycodone-Acetaminophen]     Family History: Family History  Problem Relation Age of Onset  . Myasthenia gravis Mother   . Heart disease Father   . Healthy Sister   . Prostate cancer Neg Hx   . Bladder Cancer Neg Hx   . Kidney cancer Neg Hx     Social History:  reports that he has quit smoking. His smoking use included cigarettes. He has a 27.00 pack-year smoking history. He has never used smokeless tobacco. He reports that he does not drink alcohol or use  drugs.  ROS:                                        Physical Exam: There were no vitals taken for this visit.  Constitutional:  Alert and oriented, No acute distress. HEENT: [place] AT, moist mucus membranes.  Trachea midline, no masses. Cardiovascular: No clubbing, cyanosis, or edema. Respiratory: Normal respiratory effort, no increased work of breathing. GI: Abdomen is soft, nontender, nondistended, no abdominal masses GU: No CVA tenderness Skin: No rashes, bruises or suspicious lesions. Neurologic: Grossly intact, no focal deficits, moving all 4 extremities. Psychiatric: Normal mood and affect.  Laboratory Data: Lab Results  Component Value Date   WBC 10.0 06/05/2017   HGB 16.5 06/05/2017   HCT 49.1 06/05/2017   MCV 93 06/05/2017   PLT 248 06/05/2017    Lab Results  Component Value Date   CREATININE 1.05 06/05/2017    No results found for: PSA  Lab Results  Component Value Date   TESTOSTERONE 179 (L) 12/13/2017    Lab Results  Component Value Date   HGBA1C 5.9 (H) 06/05/2017    Urinalysis    Component Value Date/Time   APPEARANCEUR Clear 01/26/2018 1402   GLUCOSEU Negative 01/26/2018 1402   BILIRUBINUR Negative 01/26/2018 1402   PROTEINUR Negative 01/26/2018 1402   UROBILINOGEN 0.2 11/07/2017 1350   NITRITE Negative 01/26/2018 1402   LEUKOCYTESUR Negative 01/26/2018 1402    Lab Results  Component Value Date   LABMICR See below: 01/26/2018   WBCUA None seen 01/26/2018   RBCUA 0-2 01/26/2018   LABEPIT 0-10 01/26/2018   BACTERIA None seen 01/26/2018    No results found for this or any previous visit. No results found for this or any previous visit. No results found for this or any previous visit. No results found for this or any previous visit. No results found for this or any previous visit. No results found for this or any previous visit. No results found for this or any previous visit. No results found for this or any  previous visit.  Assessment & Plan:    1) BPH with LUTS - improvement with tamsulosin. We discussed LUTS can improve with weight loss. We discussed PSA screening and will hold off for this year.   2) Low T - he asked about T replacement (specifically stroke, MI, DVT risk). We discussed the nature r/b of T replacement including FDA warnings and CV risk.   No follow-ups on file.  Festus Aloe, MD  [facility] [address]  [place], [place] [address] [phone]

## 2018-06-04 ENCOUNTER — Telehealth: Payer: Self-pay | Admitting: Family Medicine

## 2018-06-04 NOTE — Telephone Encounter (Signed)
Called to schedule Medicare Annual Wellness Visit with Nurse Health Advisor. If patient returns call, please note: their last AWV was on 5/22 /18 please schedule AWV with NHA any date  Thank you! For any questions please contact: Jill Alexanders [phone]  Skype [email or web address]

## 2018-06-05 DIAGNOSIS — R69 Illness, unspecified: Secondary | ICD-10-CM | POA: Diagnosis not present

## 2018-06-15 NOTE — Telephone Encounter (Signed)
I left a message asking the pt to call and schedule AWV. VDM (DD) °

## 2018-06-27 DIAGNOSIS — R635 Abnormal weight gain: Secondary | ICD-10-CM | POA: Diagnosis not present

## 2018-07-04 ENCOUNTER — Ambulatory Visit (INDEPENDENT_AMBULATORY_CARE_PROVIDER_SITE_OTHER): Payer: Medicare HMO

## 2018-07-04 VITALS — BP 138/76 | HR 98 | Temp 99.4°F | Ht 69.0 in | Wt 382.0 lb

## 2018-07-04 DIAGNOSIS — Z0000 Encounter for general adult medical examination without abnormal findings: Secondary | ICD-10-CM

## 2018-07-04 NOTE — Patient Instructions (Signed)
Mr. Jeremy Hendrix , Thank you for taking time to come for your Medicare Wellness Visit. I appreciate your ongoing commitment to your health goals. Please review the following plan we discussed and let me know if I can assist you in the future.   Screening recommendations/referrals: Colonoscopy: Up to date Recommended yearly ophthalmology/optometry visit for glaucoma screening and checkup Recommended yearly dental visit for hygiene and checkup  Vaccinations: Influenza vaccine: Up to date Pneumococcal vaccine: N/A Tdap vaccine: Up to date Shingles vaccine: Pt declines today.     Advanced directives: Please bring a copy of your POA (Power of Attorney) and/or Living Will to your next appointment.   Conditions/risks identified: Obesity- continue current diet plan of cutting back on sugar and carbohydrates in daily and exercising 3 days a week to help aid in weight loss.   Next appointment: 07/27/18 @ 10 AM with Dr Caryn Section.   Preventive Care 40-64 Years, Male Preventive care refers to lifestyle choices and visits with your health care provider that can promote health and wellness. What does preventive care include?  A yearly physical exam. This is also called an annual well check.  Dental exams once or twice a year.  Routine eye exams. Ask your health care provider how often you should have your eyes checked.  Personal lifestyle choices, including:  Daily care of your teeth and gums.  Regular physical activity.  Eating a healthy diet.  Avoiding tobacco and drug use.  Limiting alcohol use.  Practicing safe sex.  Taking low-dose aspirin every day starting at age 64. What happens during an annual well check? The services and screenings done by your health care provider during your annual well check will depend on your age, overall health, lifestyle risk factors, and family history of disease. Counseling  Your health care provider may ask you questions about your:  Alcohol  use.  Tobacco use.  Drug use.  Emotional well-being.  Home and relationship well-being.  Sexual activity.  Eating habits.  Work and work Statistician. Screening  You may have the following tests or measurements:  Height, weight, and BMI.  Blood pressure.  Lipid and cholesterol levels. These may be checked every 5 years, or more frequently if you are over 50 years old.  Skin check.  Lung cancer screening. You may have this screening every year starting at age 66 if you have a 30-pack-year history of smoking and currently smoke or have quit within the past 15 years.  Fecal occult blood test (FOBT) of the stool. You may have this test every year starting at age 55.  Flexible sigmoidoscopy or colonoscopy. You may have a sigmoidoscopy every 5 years or a colonoscopy every 10 years starting at age 7.  Prostate cancer screening. Recommendations will vary depending on your family history and other risks.  Hepatitis C blood test.  Hepatitis B blood test.  Sexually transmitted disease (STD) testing.  Diabetes screening. This is done by checking your blood sugar (glucose) after you have not eaten for a while (fasting). You may have this done every 1-3 years. Discuss your test results, treatment options, and if necessary, the need for more tests with your health care provider. Vaccines  Your health care provider may recommend certain vaccines, such as:  Influenza vaccine. This is recommended every year.  Tetanus, diphtheria, and acellular pertussis (Tdap, Td) vaccine. You may need a Td booster every 10 years.  Zoster vaccine. You may need this after age 66.  Pneumococcal 13-valent conjugate (PCV13) vaccine. You may  need this if you have certain conditions and have not been vaccinated.  Pneumococcal polysaccharide (PPSV23) vaccine. You may need one or two doses if you smoke cigarettes or if you have certain conditions. Talk to your health care provider about which screenings  and vaccines you need and how often you need them. This information is not intended to replace advice given to you by your health care provider. Make sure you discuss any questions you have with your health care provider. Document Released: 01/01/2016 Document Revised: 08/24/2016 Document Reviewed: 10/06/2015 Elsevier Interactive Patient Education  2017 [place] Prevention in the Home Falls can cause injuries. They can happen to people of all ages. There are many things you can do to make your home safe and to help prevent falls. What can I do on the outside of my home?  Regularly fix the edges of walkways and driveways and fix any cracks.  Remove anything that might make you trip as you walk through a door, such as a raised step or threshold.  Trim any bushes or trees on the path to your home.  Use bright outdoor lighting.  Clear any walking paths of anything that might make someone trip, such as rocks or tools.  Regularly check to see if handrails are loose or broken. Make sure that both sides of any steps have handrails.  Any raised decks and porches should have guardrails on the edges.  Have any leaves, snow, or ice cleared regularly.  Use sand or salt on walking paths during winter.  Clean up any spills in your garage right away. This includes oil or grease spills. What can I do in the bathroom?  Use night lights.  Install grab bars by the toilet and in the tub and shower. Do not use towel bars as grab bars.  Use non-skid mats or decals in the tub or shower.  If you need to sit down in the shower, use a plastic, non-slip stool.  Keep the floor dry. Clean up any water that spills on the floor as soon as it happens.  Remove soap buildup in the tub or shower regularly.  Attach bath mats securely with double-sided non-slip rug tape.  Do not have throw rugs and other things on the floor that can make you trip. What can I do in the bedroom?  Use night  lights.  Make sure that you have a light by your bed that is easy to reach.  Do not use any sheets or blankets that are too big for your bed. They should not hang down onto the floor.  Have a firm chair that has side arms. You can use this for support while you get dressed.  Do not have throw rugs and other things on the floor that can make you trip. What can I do in the kitchen?  Clean up any spills right away.  Avoid walking on wet floors.  Keep items that you use a lot in easy-to-reach places.  If you need to reach something above you, use a strong step stool that has a grab bar.  Keep electrical cords out of the way.  Do not use floor polish or wax that makes floors slippery. If you must use wax, use non-skid floor wax.  Do not have throw rugs and other things on the floor that can make you trip. What can I do with my stairs?  Do not leave any items on the stairs.  Make sure that there are  handrails on both sides of the stairs and use them. Fix handrails that are broken or loose. Make sure that handrails are as long as the stairways.  Check any carpeting to make sure that it is firmly attached to the stairs. Fix any carpet that is loose or worn.  Avoid having throw rugs at the top or bottom of the stairs. If you do have throw rugs, attach them to the floor with carpet tape.  Make sure that you have a light switch at the top of the stairs and the bottom of the stairs. If you do not have them, ask someone to add them for you. What else can I do to help prevent falls?  Wear shoes that:  Do not have high heels.  Have rubber bottoms.  Are comfortable and fit you well.  Are closed at the toe. Do not wear sandals.  If you use a stepladder:  Make sure that it is fully opened. Do not climb a closed stepladder.  Make sure that both sides of the stepladder are locked into place.  Ask someone to hold it for you, if possible.  Clearly mark and make sure that you can  see:  Any grab bars or handrails.  First and last steps.  Where the edge of each step is.  Use tools that help you move around (mobility aids) if they are needed. These include:  Canes.  Walkers.  Scooters.  Crutches.  Turn on the lights when you go into a dark area. Replace any light bulbs as soon as they burn out.  Set up your furniture so you have a clear path. Avoid moving your furniture around.  If any of your floors are uneven, fix them.  If there are any pets around you, be aware of where they are.  Review your medicines with your doctor. Some medicines can make you feel dizzy. This can increase your chance of falling. Ask your doctor what other things that you can do to help prevent falls. This information is not intended to replace advice given to you by your health care provider. Make sure you discuss any questions you have with your health care provider. Document Released: 10/01/2009 Document Revised: 05/12/2016 Document Reviewed: 01/09/2015 Elsevier Interactive Patient Education  2017 [employer].

## 2018-07-04 NOTE — Progress Notes (Signed)
Subjective:   Jeremy Hendrix is a 62 y.o. male who presents for Medicare Annual/Subsequent preventive examination.  Review of Systems:  N/A  Cardiac Risk Factors include: advanced age (>48men, >21 women);diabetes mellitus;hypertension;male gender;obesity (BMI >30kg/m2)     Objective:    Vitals: BP 138/76 (BP Location: Right Arm)   Pulse 98   Temp 99.4 F (37.4 C) (Oral)   Ht 5\' 9"  (1.753 m)   Wt (!) 382 lb (173.3 kg)   BMI 56.41 kg/m   Body mass index is 56.41 kg/m.  Advanced Directives 07/04/2018 05/09/2017 07/21/2015  Does Patient Have a Medical Advance Directive? No No No  Would patient like information on creating a medical advance directive? No - Patient declined Yes (ED - Information included in AVS) -    Tobacco Social History   Tobacco Use  Smoking Status Former Smoker  . Packs/day: 1.50  . Years: 18.00  . Pack years: 27.00  . Types: Cigarettes  Smokeless Tobacco Never Used  Tobacco Comment   QUIT IN 2003     Counseling given: Not Answered Comment: QUIT IN 2003   Clinical Intake:  Pre-visit preparation completed: Yes  Pain : No/denies pain Pain Score: 0-No pain     Nutritional Status: BMI > 30  Obese Nutritional Risks: None Diabetes: Yes(type 2) CBG done?: No Did pt. bring in CBG monitor from home?: No  How often do you need to have someone help you when you read instructions, pamphlets, or other written materials from your doctor or pharmacy?: 1 - Never  Interpreter Needed?: No  Information entered by :: [facility], LPN  Past Medical History:  Diagnosis Date  . Diabetes mellitus without complication ([place])    type 2  . Gout   . Hypertension   . Lateral epicondylitis of right elbow   . Pruritus   . Seborrheic dermatitis    Past Surgical History:  Procedure Laterality Date  . CATARACT EXTRACTION, BILATERAL    . COLONOSCOPY WITH PROPOFOL N/A 07/21/2015   Procedure: COLONOSCOPY WITH PROPOFOL;  Surgeon: Lucilla Lame, MD;  Location: [facility]  ENDOSCOPY;  Service: Endoscopy;  Laterality: N/A;  . HIP SURGERY     Family History  Problem Relation Age of Onset  . Myasthenia gravis Mother   . Heart disease Father   . Healthy Sister   . Prostate cancer Neg Hx   . Bladder Cancer Neg Hx   . Kidney cancer Neg Hx    Social History   Socioeconomic History  . Marital status: Single    Spouse name: Not on file  . Number of children: 0  . Years of education: Not on file  . Highest education level: Not on file  Occupational History  . Occupation: Special educational needs teacher    Comment: Woodworking  Social Needs  . Financial resource strain: Not hard at all  . Food insecurity:    Worry: Never true    Inability: Never true  . Transportation needs:    Medical: No    Non-medical: No  Tobacco Use  . Smoking status: Former Smoker    Packs/day: 1.50    Years: 18.00    Pack years: 27.00    Types: Cigarettes  . Smokeless tobacco: Never Used  . Tobacco comment: QUIT IN 2003  Substance and Sexual Activity  . Alcohol use: No    Alcohol/week: 0.0 oz  . Drug use: No  . Sexual activity: Not on file  Lifestyle  . Physical activity:    Days per  week: Not on file    Minutes per session: Not on file  . Stress: Very much  Relationships  . Social connections:    Talks on phone: Not on file    Gets together: Not on file    Attends religious service: Not on file    Active member of club or organization: Not on file    Attends meetings of clubs or organizations: Not on file    Relationship status: Not on file  Other Topics Concern  . Not on file  Social History Narrative  . Not on file    Outpatient Encounter Medications as of 07/04/2018  Medication Sig  . albuterol (VENTOLIN HFA) 108 (90 Base) MCG/ACT inhaler Inhale into the lungs.  Marland Kitchen allopurinol (ZYLOPRIM) 100 MG tablet Take 1 tablet (100 mg total) by mouth daily.  Marland Kitchen aspirin 325 MG tablet Take 325 mg by mouth daily.  Marland Kitchen atorvastatin (LIPITOR) 20 MG tablet Take 20 mg by mouth daily.  .  Cholecalciferol (VITAMIN D) 2000 UNITS CAPS Take by mouth.  . diltiazem (DILACOR XR) 180 MG 24 hr capsule Take 240 mg by mouth.   . fluticasone (FLONASE) 50 MCG/ACT nasal spray Place 2 sprays into both nostrils daily. (Patient not taking: Reported on 01/26/2018)  . Fluticasone-Salmeterol (ADVAIR) 250-50 MCG/DOSE AEPB Inhale 1 puff into the lungs 2 (two) times daily.  . INCRUSE ELLIPTA 62.5 MCG/INH AEPB   . ketoconazole (NIZORAL) 2 % cream Apply 1 application topically daily. (Patient not taking: Reported on 05/31/2018)  . LECITHIN PO Take by mouth.  Marland Kitchen lisinopril-hydrochlorothiazide (PRINZIDE,ZESTORETIC) 20-25 MG tablet Take 1 tablet by mouth daily.  . mometasone (ELOCON) 0.1 % cream Apply 1 application topically daily. Apply to ear rash when occurs (Patient not taking: Reported on 05/31/2018)  . MULTIPLE VITAMINS-MINERALS PO Take by mouth.  . Omega-3 Fatty Acids (FISH OIL) 1000 MG CAPS Take by mouth.  . ONE TOUCH ULTRA TEST test strip USE ONE STRIP TO CHECK GLUCOSE ONCE DAILY  . saw palmetto 160 MG capsule Take 160 mg by mouth 2 (two) times daily.  . tamsulosin (FLOMAX) 0.4 MG CAPS capsule Take 1 capsule (0.4 mg total) by mouth daily.  Marland Kitchen TAZTIA XT 240 MG 24 hr capsule   . vitamin E 400 UNIT capsule Take 400 Units by mouth daily.   No facility-administered encounter medications on file as of 07/04/2018.     Activities of Daily Living In your present state of health, do you have any difficulty performing the following activities: 07/04/2018  Hearing? N  Vision? N  Difficulty concentrating or making decisions? N  Walking or climbing stairs? Y  Comment Due to trouble breathing from COPD.  Dressing or bathing? N  Doing errands, shopping? N  Preparing Food and eating ? N  Using the Toilet? N  In the past six months, have you accidently leaked urine? Y  Comment Currently following up with [facility] about leakage issue.   Do you have problems with loss of bowel control? N  Managing  your Medications? N  Managing your Finances? N  Housekeeping or managing your Housekeeping? N  Some recent data might be hidden    Patient Care Team: Birdie Sons, MD as PCP - General (Family Medicine) Lorelee Cover., MD as Consulting Physician (Ophthalmology) Charolette Forward, MD as Consulting Physician (Cardiology) Brett Albino, MD as Referring Physician (Internal Medicine) Erby Pian, MD as Referring Physician (Specialist) Frederik Pear, MD as Consulting Physician (Orthopedic Surgery) Lollie Sails, MD as  Referring Physician (Internal Medicine) Gabriel Carina Betsey Holiday, MD as Physician Assistant (Endocrinology)   Assessment:   This is a routine wellness examination for [place].  Exercise Activities and Dietary recommendations Current Exercise Habits: Structured exercise class, Type of exercise: strength training/weights;yoga;Other - see comments;stretching(stationary bike), Time (Minutes): > 60, Frequency (Times/Week): 3, Weekly Exercise (Minutes/Week): 0, Intensity: Mild, Exercise limited by: respiratory conditions(s)  Goals    . DIET - REDUCE SUGAR INTAKE     Continue current diet plan of cutting back on sugar and carbohydrates in daily and exercising 3 days a week to help aid in weight loss.        Fall Risk Fall Risk  07/04/2018 05/09/2017  Falls in the past year? No No   Is the patient's home free of loose throw rugs in walkways, pet beds, electrical cords, etc?   yes      Grab bars in the bathroom? yes      Handrails on the stairs?   yes      Adequate lighting?   yes  Timed Get Up and Go Performed: N/A  Depression Screen PHQ 2/9 Scores 07/04/2018 05/09/2017 05/09/2017  PHQ - 2 Score 4 0 0  PHQ- 9 Score 5 2 -    Cognitive Function: Pt declined screening today.      6CIT Screen 05/09/2017  What Year? 0 points  What month? 0 points  What time? 0 points  Count back from 20 0 points  Months in reverse 0 points  Repeat phrase 0 points  Total Score 0     Immunization History  Administered Date(s) Administered  . Influenza,inj,Quad PF,6+ Mos 09/21/2015, 08/30/2016, 10/09/2017  . Pneumococcal Polysaccharide-23 04/20/2015  . Tdap 08/30/2016  . Zoster 08/30/2016    Qualifies for Shingles Vaccine? Due for Shingles vaccine. Declined my offer to administer today. Education has been provided regarding the importance of this vaccine. Pt has been advised to call her insurance company to determine her out of pocket expense. Advised she may also receive this vaccine at her local pharmacy or Health Dept. Verbalized acceptance and understanding.  Screening Tests Health Maintenance  Topic Date Due  . FOOT EXAM  07/07/2016  . HEMOGLOBIN A1C  12/05/2017  . OPHTHALMOLOGY EXAM  12/19/2017  . HIV Screening  12/19/2026 (Originally 03/29/1971)  . INFLUENZA VACCINE  07/19/2018  . PNEUMOCOCCAL POLYSACCHARIDE VACCINE (2) 04/19/2020  . COLONOSCOPY  07/20/2025  . TETANUS/TDAP  08/30/2026  . Hepatitis C Screening  Completed   Cancer Screenings: Lung: Low Dose CT Chest recommended if Age 27-80 years, 30 pack-year currently smoking OR have quit w/in 15years. Patient does not qualify. Colorectal: Up to date  Additional Screenings:  Hepatitis C Screening: Up to date      Plan:  I have personally reviewed and addressed the Medicare Annual Wellness questionnaire and have noted the following in the patient's chart:  A. Medical and social history B. Use of alcohol, tobacco or illicit drugs  C. Current medications and supplements D. Functional ability and status E.  Nutritional status F.  Physical activity G. Advance directives H. List of other physicians I.  Hospitalizations, surgeries, and ER visits in previous 12 months J.  [place] such as hearing and vision if needed, cognitive and depression L. Referrals and appointments - none  In addition, I have reviewed and discussed with patient certain preventive protocols, quality metrics, and  best practice recommendations. A written personalized care plan for preventive services as well as general preventive health recommendations were  provided to patient.  See attached scanned questionnaire for additional information.   Signed,  Fabio Neighbors, LPN Nurse Health Advisor   Nurse Recommendations: Pt needs a diabetic foot exam and his Hgb A1c checked at his next OV. Pt states he has had an eye exam this year. Will call [facility] to find out. Pt was unable to remember his medications today and forgot his list. Pt states he will call in to clarify these.

## 2018-07-07 ENCOUNTER — Encounter: Payer: Self-pay | Admitting: Family Medicine

## 2018-07-20 DIAGNOSIS — J449 Chronic obstructive pulmonary disease, unspecified: Secondary | ICD-10-CM | POA: Diagnosis not present

## 2018-07-20 DIAGNOSIS — G4733 Obstructive sleep apnea (adult) (pediatric): Secondary | ICD-10-CM | POA: Diagnosis not present

## 2018-07-20 DIAGNOSIS — R0609 Other forms of dyspnea: Secondary | ICD-10-CM | POA: Diagnosis not present

## 2018-07-26 NOTE — Progress Notes (Signed)
Patient: Jeremy Hendrix, Male    DOB: [birth year], 62 y.o.   MRN: [id number] Visit Date: 07/27/2018  Today's Provider: Lelon Huh, MD   Chief Complaint  Patient presents with  . Annual Exam  . COPD  . Hypertension   Subjective:   Patient saw McKenzie for AWV on 07/04/2018.    Complete Physical  He reports he is sleeping fairly well. He continues to follow up with Dr. Leeanne Rio for weight management.  -----------------------------------------------------------   Hypertension, follow-up:  BP Readings from Last 3 Encounters:  07/27/18 (!) 138/91  07/04/18 138/76  05/31/18 125/63    He was last seen for hypertension 06/05/2017.  BP at that visit was 130/82. Management since that visit includes; no changes.He reports good compliance with treatment. He is not having side effects.  He is exercising. He is adherent to low salt diet.   Outside blood pressures are checked at the gym. He is experiencing dyspnea and palpitations.  Patient denies chest pain, chest pressure/discomfort, claudication, exertional chest pressure/discomfort, irregular heart beat, lower extremity edema, near-syncope, paroxysmal nocturnal dyspnea, syncope and tachypnea.   Cardiovascular risk factors include advanced age (older than 8 for men, 14 for women), diabetes mellitus, hypertension, male gender and obesity (BMI >= 30 kg/m2).  Use of agents associated with hypertension: NSAIDS.   ------------------------------------------------------------------------  Moderate COPD (chronic obstructive pulmonary disease) ([place]) From 06/05/2017-no changes. Stable. Continue current inhlaer. Patient reports this problem is stable. Continues to see Dr. Raul Del and is doing well with current inhalers.   Type 2 diabetes mellitus without complication, without long-term current use of insulin ([place]) From 06/05/2017-followed by Dr. Nilda Simmer. Hemoglobin A1c 5.9.  He reports he still see cardiologist annually. Denies  any chest pain or dyspnea.   Wt Readings from Last 5 Encounters:  07/27/18 (!) 370 lb (167.8 kg)  07/04/18 (!) 382 lb (173.3 kg)  05/31/18 (!) 375 lb 6.4 oz (170.3 kg)  01/26/18 (!) 386 lb 14.4 oz (175.5 kg)  10/18/17 (!) 390 lb 3.2 oz (177 kg)   He does report he injured his left forearm when it hit something 2 days ago, but did not elaborate. No trouble using arm, hands, or fingers.   Review of Systems  Constitutional: Positive for fatigue. Negative for appetite change, chills and fever.  HENT: Positive for dental problem and tinnitus. Negative for congestion, ear pain, hearing loss, nosebleeds and trouble swallowing.   Eyes: Negative for pain and visual disturbance.  Respiratory: Positive for cough, shortness of breath and wheezing. Negative for chest tightness.   Cardiovascular: Positive for palpitations. Negative for chest pain and leg swelling.  Gastrointestinal: Negative for abdominal pain, blood in stool, constipation, diarrhea, nausea and vomiting.  Endocrine: Negative for polydipsia, polyphagia and polyuria.  Genitourinary: Negative for dysuria and flank pain.  Musculoskeletal: Negative for arthralgias, back pain, joint swelling, myalgias and neck stiffness.  Skin: Negative for color change, rash and wound.  Neurological: Positive for dizziness. Negative for tremors, seizures, speech difficulty, weakness, light-headedness and headaches.  Psychiatric/Behavioral: Positive for agitation. Negative for behavioral problems, confusion, decreased concentration, dysphoric mood and sleep disturbance. The patient is not nervous/anxious.   All other systems reviewed and are negative.   Social History   Socioeconomic History  . Marital status: Single    Spouse name: Not on file  . Number of children: 0  . Years of education: Not on file  . Highest education level: Not on file  Occupational History  . Occupation: Special educational needs teacher  Comment: Woodworking  Social Needs  . Financial  resource strain: Not hard at all  . Food insecurity:    Worry: Never true    Inability: Never true  . Transportation needs:    Medical: No    Non-medical: No  Tobacco Use  . Smoking status: Former Smoker    Packs/day: 1.50    Years: 18.00    Pack years: 27.00    Types: Cigarettes  . Smokeless tobacco: Never Used  . Tobacco comment: QUIT IN 2003  Substance and Sexual Activity  . Alcohol use: No    Alcohol/week: 0.0 standard drinks  . Drug use: No  . Sexual activity: Not on file  Lifestyle  . Physical activity:    Days per week: Not on file    Minutes per session: Not on file  . Stress: Very much  Relationships  . Social connections:    Talks on phone: Not on file    Gets together: Not on file    Attends religious service: Not on file    Active member of club or organization: Not on file    Attends meetings of clubs or organizations: Not on file    Relationship status: Not on file  . Intimate partner violence:    Fear of current or ex partner: Not on file    Emotionally abused: Not on file    Physically abused: Not on file    Forced sexual activity: Not on file  Other Topics Concern  . Not on file  Social History Narrative  . Not on file    Past Medical History:  Diagnosis Date  . Diabetes mellitus without complication ([place])    type 2  . Gout   . Hypertension   . Lateral epicondylitis of right elbow   . Pruritus   . Seborrheic dermatitis      Patient Active Problem List   Diagnosis Date Noted  . Hypogonadism in male 11/06/2017  . Morbid obesity ([place]) 06/05/2017  . Anxiety 09/10/2015  . CAFL (chronic airflow limitation) ([place]) 09/10/2015  . Diabetes ([place]) 09/10/2015  . Gout 09/10/2015  . Familial multiple lipoprotein-type hyperlipidemia 09/10/2015  . BP (high blood pressure) 09/10/2015  . Eczema intertrigo 09/10/2015  . Backhand tennis elbow 09/10/2015  . Benign neoplasm of ascending colon   . Benign neoplasm of sigmoid colon   . Breathlessness on  exertion 07/02/2014  . Moderate COPD (chronic obstructive pulmonary disease) ([place]) 07/02/2014    Past Surgical History:  Procedure Laterality Date  . CATARACT EXTRACTION, BILATERAL    . COLONOSCOPY WITH PROPOFOL N/A 07/21/2015   Procedure: COLONOSCOPY WITH PROPOFOL;  Surgeon: Lucilla Lame, MD;  Location: [facility] ENDOSCOPY;  Service: Endoscopy;  Laterality: N/A;  . HIP SURGERY      His family history includes Healthy in his sister; Heart disease in his father; Myasthenia gravis in his mother. There is no history of Prostate cancer, Bladder Cancer, or Kidney cancer.      Current Outpatient Medications:  .  albuterol (VENTOLIN HFA) 108 (90 Base) MCG/ACT inhaler, Inhale into the lungs., Disp: , Rfl:  .  allopurinol (ZYLOPRIM) 100 MG tablet, Take 1 tablet (100 mg total) by mouth daily., Disp: 90 tablet, Rfl: 4 .  aspirin 325 MG tablet, Take 325 mg by mouth daily., Disp: , Rfl:  .  atorvastatin (LIPITOR) 20 MG tablet, Take 20 mg by mouth daily., Disp: , Rfl:  .  Cholecalciferol (VITAMIN D) 2000 UNITS CAPS, Take by mouth., Disp: , Rfl:  .  diltiazem (DILACOR XR) 180 MG 24 hr capsule, Take 240 mg by mouth. , Disp: , Rfl:  .  fluticasone (FLONASE) 50 MCG/ACT nasal spray, Place 2 sprays into both nostrils daily., Disp: 16 g, Rfl: 0 .  Fluticasone-Salmeterol (ADVAIR) 250-50 MCG/DOSE AEPB, Inhale 1 puff into the lungs 2 (two) times daily., Disp: 60 each, Rfl: 4 .  INCRUSE ELLIPTA 62.5 MCG/INH AEPB, , Disp: , Rfl:  .  ketoconazole (NIZORAL) 2 % cream, Apply 1 application topically daily., Disp: 60 g, Rfl: 2 .  LECITHIN PO, Take by mouth., Disp: , Rfl:  .  lisinopril-hydrochlorothiazide (PRINZIDE,ZESTORETIC) 20-25 MG tablet, Take 1 tablet by mouth daily., Disp: 90 tablet, Rfl: 4 .  mometasone (ELOCON) 0.1 % cream, Apply 1 application topically daily. Apply to ear rash when occurs, Disp: 30 g, Rfl: 0 .  MULTIPLE VITAMINS-MINERALS PO, Take by mouth., Disp: , Rfl:  .  Omega-3 Fatty Acids (FISH OIL) 1000 MG  CAPS, Take by mouth., Disp: , Rfl:  .  ONE TOUCH ULTRA TEST test strip, USE ONE STRIP TO CHECK GLUCOSE ONCE DAILY, Disp: 100 each, Rfl: 7 .  saw palmetto 160 MG capsule, Take 160 mg by mouth 2 (two) times daily., Disp: , Rfl:  .  tamsulosin (FLOMAX) 0.4 MG CAPS capsule, Take 1 capsule (0.4 mg total) by mouth daily., Disp: 30 capsule, Rfl: 11 .  TAZTIA XT 240 MG 24 hr capsule, , Disp: , Rfl:  .  vitamin E 400 UNIT capsule, Take 400 Units by mouth daily., Disp: , Rfl:   Patient Care Team: Birdie Sons, MD as PCP - General (Family Medicine) Lorelee Cover., MD as Consulting Physician (Ophthalmology) Charolette Forward, MD as Consulting Physician (Cardiology) Brett Albino, MD as Referring Physician (Internal Medicine) Erby Pian, MD as Referring Physician (Specialist) Frederik Pear, MD as Consulting Physician (Orthopedic Surgery) Lollie Sails, MD as Referring Physician (Internal Medicine) Gabriel Carina Betsey Holiday, MD as Physician Assistant (Endocrinology)     Objective:   Vitals: BP (!) 138/91 (BP Location: Left Arm, Patient Position: Sitting, Cuff Size: Large)   Pulse (!) 103   Temp 98.7 F (37.1 C) (Oral)   Resp 20   Ht 5\' 9"  (1.753 m)   Wt (!) 370 lb (167.8 kg)   SpO2 95% Comment: room air  BMI 54.64 kg/m   Physical Exam   General Appearance:    Alert, cooperative, no distress, appears stated age, morbidly obese  Head:    Normocephalic, without obvious abnormality, atraumatic  Eyes:    PERRL, conjunctiva/corneas clear, EOM's intact, fundi    benign, both eyes       Ears:    Normal TM's and external ear canals, both ears  Nose:   Nares normal, septum midline, mucosa normal, no drainage   or sinus tenderness  Throat:   Lips, mucosa, and tongue normal; teeth and gums normal  Neck:   Supple, symmetrical, trachea midline, no adenopathy;       thyroid:  No enlargement/tenderness/nodules; no carotid   bruit or JVD  Back:     Symmetric, no curvature, ROM normal, no CVA  tenderness  Lungs:     Clear to auscultation bilaterally, respirations unlabored  Chest wall:    No tenderness or deformity  Heart:    Regular rate and rhythm, S1 and S2 normal, no murmur, rub   or gallop  Abdomen:     Soft, non-tender, bowel sounds active all four quadrants,    no masses, no organomegaly  Genitalia:    deferred  Rectal:    deferred  Extremities:   Slightly tender mild contusion ulnar aspect of left forearm. FROM elbow wrist and fingers.   Pulses:   2+ and symmetric all extremities  Skin:   Skin color, texture, turgor normal, no rashes or lesions  Lymph nodes:   Cervical, supraclavicular, and axillary nodes normal  Neurologic:   CNII-XII intact. Normal strength, sensation and reflexes      throughout    Activities of Daily Living In your present state of health, do you have any difficulty performing the following activities: 07/04/2018  Hearing? N  Vision? N  Difficulty concentrating or making decisions? N  Walking or climbing stairs? Y  Comment Due to trouble breathing from COPD.  Dressing or bathing? N  Doing errands, shopping? N  Preparing Food and eating ? N  Using the Toilet? N  In the past six months, have you accidently leaked urine? Y  Comment Currently following up with [facility] about leakage issue.   Do you have problems with loss of bowel control? N  Managing your Medications? N  Managing your Finances? N  Housekeeping or managing your Housekeeping? N  Some recent data might be hidden    Fall Risk Assessment Fall Risk  07/04/2018 05/09/2017  Falls in the past year? No No     Depression Screen PHQ 2/9 Scores 07/04/2018 05/09/2017 05/09/2017  PHQ - 2 Score 4 0 0  PHQ- 9 Score 5 2 -      Assessment & Plan:    Annual Physical Reviewed patient's Family Medical History Reviewed and updated list of patient's medical providers Assessment of cognitive impairment was done Assessed patient's functional ability Established a written  schedule for health screening [place] Completed and Reviewed  Exercise Activities and Dietary recommendations Goals    . diet     Recommend to continue to cut down on carbohydrates in daily diet.    Marland Kitchen DIET - REDUCE SUGAR INTAKE     Continue current diet plan of cutting back on sugar and carbohydrates in daily and exercising 3 days a week to help aid in weight loss.        Immunization History  Administered Date(s) Administered  . Influenza,inj,Quad PF,6+ Mos 09/21/2015, 08/30/2016, 10/09/2017  . Pneumococcal Polysaccharide-23 04/20/2015  . Tdap 08/30/2016  . Zoster 08/30/2016    Health Maintenance  Topic Date Due  . FOOT EXAM  07/07/2016  . HEMOGLOBIN A1C  12/05/2017  . INFLUENZA VACCINE  07/19/2018  . HIV Screening  12/19/2026 (Originally 03/29/1971)  . OPHTHALMOLOGY EXAM  05/03/2019  . PNEUMOCOCCAL POLYSACCHARIDE VACCINE (2) 04/19/2020  . COLONOSCOPY  07/20/2025  . TETANUS/TDAP  08/30/2026  . Hepatitis C Screening  Completed     Discussed health benefits of physical activity, and encouraged him to engage in regular exercise appropriate for his age and condition.    ------------------------------------------------------------------------------------------------------------   2. Type 2 diabetes mellitus without complication, without long-term current use of insulin (HCC) Continue regular follow up endocrine.  - Comprehensive metabolic panel - Lipid panel  3. Prostate cancer screening  - PSA  4. Essential hypertension Stable Continue current medications.    5. Morbid obesity ([place]) Continue regular follow up Dr. Leeanne Rio  6. Moderate COPD (chronic obstructive pulmonary disease) (HCC) Well controlled.  Continue current medications.  Continue routine follow up Dr. Raul Del.   7. Contusion left forearm.    Lelon Huh, MD  [facility]

## 2018-07-27 ENCOUNTER — Encounter: Payer: Self-pay | Admitting: Family Medicine

## 2018-07-27 ENCOUNTER — Ambulatory Visit (INDEPENDENT_AMBULATORY_CARE_PROVIDER_SITE_OTHER): Payer: Medicare HMO | Admitting: Family Medicine

## 2018-07-27 VITALS — BP 138/91 | HR 103 | Temp 98.7°F | Resp 20 | Ht 69.0 in | Wt 370.0 lb

## 2018-07-27 DIAGNOSIS — Z0000 Encounter for general adult medical examination without abnormal findings: Secondary | ICD-10-CM | POA: Diagnosis not present

## 2018-07-27 DIAGNOSIS — I10 Essential (primary) hypertension: Secondary | ICD-10-CM

## 2018-07-27 DIAGNOSIS — E119 Type 2 diabetes mellitus without complications: Secondary | ICD-10-CM

## 2018-07-27 DIAGNOSIS — J449 Chronic obstructive pulmonary disease, unspecified: Secondary | ICD-10-CM

## 2018-07-27 DIAGNOSIS — Z125 Encounter for screening for malignant neoplasm of prostate: Secondary | ICD-10-CM

## 2018-07-28 LAB — COMPREHENSIVE METABOLIC PANEL
ALBUMIN: 4.4 g/dL (ref 3.6–4.8)
ALT: 44 IU/L (ref 0–44)
AST: 26 IU/L (ref 0–40)
Albumin/Globulin Ratio: 2.1 (ref 1.2–2.2)
Alkaline Phosphatase: 57 IU/L (ref 39–117)
BUN/Creatinine Ratio: 14 (ref 10–24)
BUN: 13 mg/dL (ref 8–27)
Bilirubin Total: 1.4 mg/dL — ABNORMAL HIGH (ref 0.0–1.2)
CALCIUM: 9.9 mg/dL (ref 8.6–10.2)
CHLORIDE: 100 mmol/L (ref 96–106)
CO2: 23 mmol/L (ref 20–29)
CREATININE: 0.93 mg/dL (ref 0.76–1.27)
GFR calc Af Amer: 101 mL/min/{1.73_m2} (ref >59)
GFR, EST NON AFRICAN AMERICAN: 88 mL/min/{1.73_m2} (ref >59)
Globulin, Total: 2.1 g/dL (ref 1.5–4.5)
Glucose: 144 mg/dL — ABNORMAL HIGH (ref 65–99)
Potassium: 4.1 mmol/L (ref 3.5–5.2)
Sodium: 140 mmol/L (ref 134–144)
Total Protein: 6.5 g/dL (ref 6.0–8.5)

## 2018-07-28 LAB — LIPID PANEL
Chol/HDL Ratio: 3.5 ratio (ref 0.0–5.0)
Cholesterol, Total: 159 mg/dL (ref 100–199)
HDL: 46 mg/dL (ref >39)
LDL CALC: 89 mg/dL (ref 0–99)
Triglycerides: 119 mg/dL (ref 0–149)
VLDL Cholesterol Cal: 24 mg/dL (ref 5–40)

## 2018-07-28 LAB — PSA: PROSTATE SPECIFIC AG, SERUM: 2.3 ng/mL (ref 0.0–4.0)

## 2018-07-30 DIAGNOSIS — R69 Illness, unspecified: Secondary | ICD-10-CM | POA: Diagnosis not present

## 2018-08-02 DIAGNOSIS — R69 Illness, unspecified: Secondary | ICD-10-CM | POA: Diagnosis not present

## 2018-08-09 DIAGNOSIS — G4733 Obstructive sleep apnea (adult) (pediatric): Secondary | ICD-10-CM | POA: Diagnosis not present

## 2018-08-09 DIAGNOSIS — E119 Type 2 diabetes mellitus without complications: Secondary | ICD-10-CM | POA: Diagnosis not present

## 2018-08-09 DIAGNOSIS — R69 Illness, unspecified: Secondary | ICD-10-CM | POA: Diagnosis not present

## 2018-08-09 DIAGNOSIS — N400 Enlarged prostate without lower urinary tract symptoms: Secondary | ICD-10-CM | POA: Diagnosis not present

## 2018-08-09 DIAGNOSIS — E785 Hyperlipidemia, unspecified: Secondary | ICD-10-CM | POA: Diagnosis not present

## 2018-08-09 DIAGNOSIS — I10 Essential (primary) hypertension: Secondary | ICD-10-CM | POA: Diagnosis not present

## 2018-08-09 DIAGNOSIS — J449 Chronic obstructive pulmonary disease, unspecified: Secondary | ICD-10-CM | POA: Diagnosis not present

## 2018-08-13 ENCOUNTER — Other Ambulatory Visit: Payer: Self-pay | Admitting: Family Medicine

## 2018-08-13 DIAGNOSIS — B356 Tinea cruris: Secondary | ICD-10-CM

## 2018-08-13 MED ORDER — KETOCONAZOLE 2 % EX CREA: 1.0000 "application " | TOPICAL_CREAM | Freq: Every day | CUTANEOUS | 2 refills | Status: DC

## 2018-08-13 NOTE — Telephone Encounter (Signed)
Please advise 

## 2018-08-13 NOTE — Telephone Encounter (Signed)
Pt needs a refill on Ketoconazole 2 %  He wants to know if he can get a larger tube  [facility] on [address]  CB#  [phone]  thanks [employer]

## 2018-08-17 DIAGNOSIS — E119 Type 2 diabetes mellitus without complications: Secondary | ICD-10-CM | POA: Diagnosis not present

## 2018-08-17 DIAGNOSIS — G629 Polyneuropathy, unspecified: Secondary | ICD-10-CM | POA: Diagnosis not present

## 2018-08-17 DIAGNOSIS — M949 Disorder of cartilage, unspecified: Secondary | ICD-10-CM | POA: Diagnosis not present

## 2018-09-17 ENCOUNTER — Ambulatory Visit: Payer: Medicare HMO | Admitting: Family Medicine

## 2018-09-17 NOTE — Progress Notes (Deleted)
       Patient: Jeremy Hendrix Male    DOB: [birth year]   62 y.o.   MRN: [id number] Visit Date: 09/17/2018  Today's Provider: Lelon Huh, MD   No chief complaint on file.  Subjective:    HPI     Allergies  Allergen Reactions  . Percocet [Oxycodone-Acetaminophen]     Hives and itching     Current Outpatient Medications:  .  albuterol (VENTOLIN HFA) 108 (90 Base) MCG/ACT inhaler, Inhale into the lungs., Disp: , Rfl:  .  allopurinol (ZYLOPRIM) 100 MG tablet, Take 1 tablet (100 mg total) by mouth daily., Disp: 90 tablet, Rfl: 4 .  aspirin 325 MG tablet, Take 325 mg by mouth daily., Disp: , Rfl:  .  atorvastatin (LIPITOR) 20 MG tablet, Take 20 mg by mouth daily., Disp: , Rfl:  .  Cholecalciferol (VITAMIN D) 2000 UNITS CAPS, Take by mouth., Disp: , Rfl:  .  diltiazem (DILACOR XR) 180 MG 24 hr capsule, Take 240 mg by mouth. , Disp: , Rfl:  .  fluticasone (FLONASE) 50 MCG/ACT nasal spray, Place 2 sprays into both nostrils daily., Disp: 16 g, Rfl: 0 .  Fluticasone-Salmeterol (ADVAIR) 250-50 MCG/DOSE AEPB, Inhale 1 puff into the lungs 2 (two) times daily., Disp: 60 each, Rfl: 4 .  INCRUSE ELLIPTA 62.5 MCG/INH AEPB, , Disp: , Rfl:  .  ketoconazole (NIZORAL) 2 % cream, Apply 1 application topically daily., Disp: 90 g, Rfl: 2 .  LECITHIN PO, Take by mouth., Disp: , Rfl:  .  lisinopril-hydrochlorothiazide (PRINZIDE,ZESTORETIC) 20-25 MG tablet, Take 1 tablet by mouth daily., Disp: 90 tablet, Rfl: 4 .  mometasone (ELOCON) 0.1 % cream, Apply 1 application topically daily. Apply to ear rash when occurs, Disp: 30 g, Rfl: 0 .  MULTIPLE VITAMINS-MINERALS PO, Take by mouth., Disp: , Rfl:  .  Omega-3 Fatty Acids (FISH OIL) 1000 MG CAPS, Take by mouth., Disp: , Rfl:  .  ONE TOUCH ULTRA TEST test strip, USE ONE STRIP TO CHECK GLUCOSE ONCE DAILY, Disp: 100 each, Rfl: 7 .  saw palmetto 160 MG capsule, Take 160 mg by mouth 2 (two) times daily., Disp: , Rfl:  .  tamsulosin (FLOMAX) 0.4 MG CAPS  capsule, Take 1 capsule (0.4 mg total) by mouth daily., Disp: 30 capsule, Rfl: 11 .  TAZTIA XT 240 MG 24 hr capsule, , Disp: , Rfl:  .  vitamin E 400 UNIT capsule, Take 400 Units by mouth daily., Disp: , Rfl:   Review of Systems  Constitutional: Negative for appetite change, chills and fever.  Respiratory: Negative for chest tightness, shortness of breath and wheezing.   Cardiovascular: Negative for chest pain and palpitations.  Gastrointestinal: Negative for abdominal pain, nausea and vomiting.    Social History   Tobacco Use  . Smoking status: Former Smoker    Packs/day: 1.50    Years: 18.00    Pack years: 27.00    Types: Cigarettes  . Smokeless tobacco: Never Used  . Tobacco comment: QUIT IN 2003  Substance Use Topics  . Alcohol use: No    Alcohol/week: 0.0 standard drinks   Objective:   There were no vitals taken for this visit. There were no vitals filed for this visit.   Physical Exam      Assessment & Plan:           Lelon Huh, MD  [facility]

## 2018-09-20 NOTE — Progress Notes (Signed)
Patient: Jeremy Hendrix Male    DOB: [birth year]   62 y.o.   MRN: [id number] Visit Date: 09/21/2018  Today's Provider: Lelon Huh, MD   Chief Complaint  Patient presents with  . Memory Loss   Subjective:    HPI   Patient comes in today c/o possible memory loss. He reports that it has progressively gotten worse for the last 6 weeks.   He reports three distinct episodes of confusion over the last several weeks. He states these episodes were very different from his typical "Senior moments" of having trouble with recall. States that one episodes was after signing a document and then could comprehend what it was he just signed. Another episode he was telling a familiar story and couldn't remember what he was talking about and unable to find words to continue. He found each episode very disconcerting and not at all similar to typical forgetfulness.    He does state that he has occasional episodes of palpitations associated with breathlessness. These have been going for many years, typically only last a few seconds. Occur just a few times per month.  MMSE - Mini Mental State Exam 09/21/2018  Orientation to time 5  Orientation to Place 5  Registration 3  Attention/ Calculation 5  Recall 3  Language- name 2 objects 2  Language- repeat 1  Language- follow 3 step command 3  Language- read & follow direction 1  Write a sentence 1  Copy design 1  Total score 30     Allergies  Allergen Reactions  . Percocet [Oxycodone-Acetaminophen]     Hives and itching     Current Outpatient Medications:  .  albuterol (VENTOLIN HFA) 108 (90 Base) MCG/ACT inhaler, Inhale into the lungs., Disp: , Rfl:  .  allopurinol (ZYLOPRIM) 100 MG tablet, Take 1 tablet (100 mg total) by mouth daily., Disp: 90 tablet, Rfl: 4 .  aspirin 325 MG tablet, Take 325 mg by mouth daily., Disp: , Rfl:  .  atorvastatin (LIPITOR) 20 MG tablet, Take 20 mg by mouth daily., Disp: , Rfl:  .  Cholecalciferol (VITAMIN  D) 2000 UNITS CAPS, Take by mouth., Disp: , Rfl:  .  diltiazem (DILACOR XR) 180 MG 24 hr capsule, Take 240 mg by mouth. , Disp: , Rfl:  .  fluticasone (FLONASE) 50 MCG/ACT nasal spray, Place 2 sprays into both nostrils daily., Disp: 16 g, Rfl: 0 .  Fluticasone-Salmeterol (ADVAIR) 250-50 MCG/DOSE AEPB, Inhale 1 puff into the lungs 2 (two) times daily., Disp: 60 each, Rfl: 4 .  INCRUSE ELLIPTA 62.5 MCG/INH AEPB, , Disp: , Rfl:  .  ketoconazole (NIZORAL) 2 % cream, Apply 1 application topically daily., Disp: 90 g, Rfl: 2 .  LECITHIN PO, Take by mouth., Disp: , Rfl:  .  lisinopril-hydrochlorothiazide (PRINZIDE,ZESTORETIC) 20-25 MG tablet, Take 1 tablet by mouth daily., Disp: 90 tablet, Rfl: 4 .  mometasone (ELOCON) 0.1 % cream, Apply 1 application topically daily. Apply to ear rash when occurs, Disp: 30 g, Rfl: 0 .  MULTIPLE VITAMINS-MINERALS PO, Take by mouth., Disp: , Rfl:  .  Omega-3 Fatty Acids (FISH OIL) 1000 MG CAPS, Take by mouth., Disp: , Rfl:  .  ONE TOUCH ULTRA TEST test strip, USE ONE STRIP TO CHECK GLUCOSE ONCE DAILY, Disp: 100 each, Rfl: 7 .  saw palmetto 160 MG capsule, Take 160 mg by mouth 2 (two) times daily., Disp: , Rfl:  .  tamsulosin (FLOMAX) 0.4 MG CAPS capsule, Take 1  capsule (0.4 mg total) by mouth daily., Disp: 30 capsule, Rfl: 11 .  TAZTIA XT 240 MG 24 hr capsule, , Disp: , Rfl:  .  vitamin E 400 UNIT capsule, Take 400 Units by mouth daily., Disp: , Rfl:   Review of Systems  Constitutional: Negative for appetite change, chills and fever.  Respiratory: Negative for chest tightness, shortness of breath and wheezing.   Cardiovascular: Negative for chest pain and palpitations.  Gastrointestinal: Negative for abdominal pain, nausea and vomiting.    Social History   Tobacco Use  . Smoking status: Former Smoker    Packs/day: 1.50    Years: 18.00    Pack years: 27.00    Types: Cigarettes  . Smokeless tobacco: Never Used  . Tobacco comment: QUIT IN 2003  Substance Use  Topics  . Alcohol use: No    Alcohol/week: 0.0 standard drinks   Objective:   BP 132/64 (BP Location: Right Arm, Patient Position: Sitting, Cuff Size: Large)   Pulse 96   Temp 99.3 F (37.4 C)   Resp (!) 24   Wt (!) 370 lb 12.8 oz (168.2 kg)   SpO2 96%   BMI 54.76 kg/m  Vitals:   09/21/18 0817  BP: 132/64  Pulse: 96  Resp: (!) 24  Temp: 99.3 F (37.4 C)  SpO2: 96%  Weight: (!) 370 lb 12.8 oz (168.2 kg)     Physical Exam  General Appearance:    Alert, cooperative, no distress, morbidly obese  Eyes:    PERRL, conjunctiva/corneas clear, EOM's intact       Lungs:     Clear to auscultation bilaterally, respirations unlabored  Heart:    Regular rate and rhythm  Neurologic:   Awake, alert, oriented x 3. No apparent focal neurological           defect.           Assessment & Plan:     1. Transient memory loss Possible TIA, need to rule out cerebrovascular disease. Consider neurology referral.  - MR MRA HEAD WO CONTRAST; Future  2. Flu vaccine need  - Flu Vaccine QUAD 6+ mos PF IM (Fluarix Quad PF)  3. Palpitations Infrequent. Need to rule out atrial fibrillation.  - Cardiac event monitor; Future       Lelon Huh, MD  [facility]

## 2018-09-21 ENCOUNTER — Ambulatory Visit (INDEPENDENT_AMBULATORY_CARE_PROVIDER_SITE_OTHER): Payer: Medicare HMO | Admitting: Family Medicine

## 2018-09-21 ENCOUNTER — Encounter: Payer: Self-pay | Admitting: Family Medicine

## 2018-09-21 VITALS — BP 132/64 | HR 96 | Temp 99.3°F | Resp 24 | Wt 370.8 lb

## 2018-09-21 DIAGNOSIS — Z23 Encounter for immunization: Secondary | ICD-10-CM

## 2018-09-21 DIAGNOSIS — R002 Palpitations: Secondary | ICD-10-CM

## 2018-09-21 DIAGNOSIS — R413 Other amnesia: Secondary | ICD-10-CM

## 2018-09-25 ENCOUNTER — Telehealth: Payer: Self-pay | Admitting: Family Medicine

## 2018-09-25 NOTE — Telephone Encounter (Signed)
Jeremy Hendrix w/ [facility] MRI - [phone]  Question on the MRI Order Dr. Caryn Section placed for pt. Pt has an appt tomorrow.  Please call to clarify asap.  Thanks, [employer]

## 2018-09-25 NOTE — Telephone Encounter (Signed)
LMTCB

## 2018-09-26 ENCOUNTER — Ambulatory Visit
Admission: RE | Admit: 2018-09-26 | Discharge: 2018-09-26 | Disposition: A | Discharge status: Home / Self Care | Payer: Medicare HMO | Source: Ambulatory Visit | Attending: Family Medicine | Admitting: Family Medicine

## 2018-09-26 ENCOUNTER — Encounter: Payer: Self-pay | Admitting: Family Medicine

## 2018-09-26 ENCOUNTER — Telehealth: Payer: Self-pay

## 2018-09-26 DIAGNOSIS — G319 Degenerative disease of nervous system, unspecified: Secondary | ICD-10-CM | POA: Diagnosis not present

## 2018-09-26 DIAGNOSIS — R410 Disorientation, unspecified: Secondary | ICD-10-CM | POA: Diagnosis not present

## 2018-09-26 DIAGNOSIS — R413 Other amnesia: Secondary | ICD-10-CM

## 2018-09-26 NOTE — Telephone Encounter (Signed)
Megan from Imaging called stating the order needs to be changed to MRI of brain. Patient is scheduled to come in today at 1:15pm. I placed a new order. Please review and sign. Thanks.

## 2018-09-27 ENCOUNTER — Telehealth: Payer: Self-pay

## 2018-09-27 ENCOUNTER — Telehealth: Payer: Self-pay | Admitting: Family Medicine

## 2018-09-27 DIAGNOSIS — R002 Palpitations: Secondary | ICD-10-CM

## 2018-09-27 NOTE — Telephone Encounter (Signed)
Advised patient as below.  

## 2018-09-27 NOTE — Telephone Encounter (Signed)
A message was sent to you on 09/24/18 through CRM but I haven't heard back on what to do.You ordered a cardiac event monitor for pt.In order for this test to be done he will need a referral to cardiology.They will only do this test for established patients

## 2018-09-27 NOTE — Telephone Encounter (Signed)
Please advise 

## 2018-09-27 NOTE — Telephone Encounter (Signed)
Copied from [place] [id number]. Topic: Referral - Status >> Sep 24, 2018  3:46 PM Parke Poisson wrote: Reason for CRM: You ordered a cardiac event monitor for pt.In order for this test to be done he will need a referral to cardiology.They will only do this test for established patients

## 2018-09-27 NOTE — Telephone Encounter (Signed)
Left message to call back  

## 2018-09-27 NOTE — Telephone Encounter (Signed)
Have entered order for cardiology referral.

## 2018-09-27 NOTE — Telephone Encounter (Signed)
-----   Message from Birdie Sons, MD sent at 09/26/2018  5:07 PM EDT ----- MRI is normal. No sign of strokes of ministrokes.  Need referral to neurology for further. Have sent order for referral.

## 2018-09-27 NOTE — Telephone Encounter (Signed)
Please review. Thanks!  

## 2018-09-28 ENCOUNTER — Encounter: Payer: Self-pay | Admitting: Family Medicine

## 2018-09-28 DIAGNOSIS — R635 Abnormal weight gain: Secondary | ICD-10-CM | POA: Diagnosis not present

## 2018-09-28 DIAGNOSIS — E119 Type 2 diabetes mellitus without complications: Secondary | ICD-10-CM | POA: Diagnosis not present

## 2018-09-28 DIAGNOSIS — I10 Essential (primary) hypertension: Secondary | ICD-10-CM | POA: Diagnosis not present

## 2018-09-28 NOTE — Telephone Encounter (Signed)
Pt stated that he was scheduled to see Dr. Rockey Situ for cardio referral to see about heart monitor testing. Pt stated that he already has a cardiovascular doctor that he sees in [place]. Pt stated his cardiologist is Dr. Terrence Dupont with [facility]. Pt is requesting call back to see if there was a reason for pt to see Dr. Rockey Situ or if he could schedule an appt his cardio provider Dr. Terrence Dupont to see about the halter monitor. Please advise. Thanks TNP

## 2018-09-28 NOTE — Telephone Encounter (Signed)
Please see Mychart Message

## 2018-09-28 NOTE — Telephone Encounter (Signed)
The only way an cardiac event monitor can be ordered in [place] is by seeing a local cardiologist first.

## 2018-09-28 NOTE — Telephone Encounter (Signed)
Please advise 

## 2018-10-11 DIAGNOSIS — R55 Syncope and collapse: Secondary | ICD-10-CM | POA: Diagnosis not present

## 2018-10-11 DIAGNOSIS — R69 Illness, unspecified: Secondary | ICD-10-CM | POA: Diagnosis not present

## 2018-10-11 DIAGNOSIS — E785 Hyperlipidemia, unspecified: Secondary | ICD-10-CM | POA: Diagnosis not present

## 2018-10-11 DIAGNOSIS — J449 Chronic obstructive pulmonary disease, unspecified: Secondary | ICD-10-CM | POA: Diagnosis not present

## 2018-10-11 DIAGNOSIS — I10 Essential (primary) hypertension: Secondary | ICD-10-CM | POA: Diagnosis not present

## 2018-10-11 DIAGNOSIS — N400 Enlarged prostate without lower urinary tract symptoms: Secondary | ICD-10-CM | POA: Diagnosis not present

## 2018-10-11 DIAGNOSIS — G4733 Obstructive sleep apnea (adult) (pediatric): Secondary | ICD-10-CM | POA: Diagnosis not present

## 2018-10-11 DIAGNOSIS — E119 Type 2 diabetes mellitus without complications: Secondary | ICD-10-CM | POA: Diagnosis not present

## 2018-10-17 ENCOUNTER — Other Ambulatory Visit: Payer: Self-pay

## 2018-10-17 DIAGNOSIS — R69 Illness, unspecified: Secondary | ICD-10-CM | POA: Diagnosis not present

## 2018-10-17 MED ORDER — GLUCOSE BLOOD VI STRP: ORAL_STRIP | 3 refills | Status: DC

## 2018-10-17 NOTE — Telephone Encounter (Signed)
Patient called office requesting One Touch Ultra- 50 test strips, patient states that he only has 6 left. He request that Rx be sent to [facility] on [address]

## 2018-10-31 DIAGNOSIS — E785 Hyperlipidemia, unspecified: Secondary | ICD-10-CM | POA: Diagnosis not present

## 2018-10-31 DIAGNOSIS — E119 Type 2 diabetes mellitus without complications: Secondary | ICD-10-CM | POA: Diagnosis not present

## 2018-10-31 LAB — HEMOGLOBIN A1C: Hemoglobin A1C: 6.2

## 2018-11-06 DIAGNOSIS — E113393 Type 2 diabetes mellitus with moderate nonproliferative diabetic retinopathy without macular edema, bilateral: Secondary | ICD-10-CM | POA: Diagnosis not present

## 2018-11-07 DIAGNOSIS — Z23 Encounter for immunization: Secondary | ICD-10-CM | POA: Diagnosis not present

## 2018-11-07 DIAGNOSIS — E119 Type 2 diabetes mellitus without complications: Secondary | ICD-10-CM | POA: Diagnosis not present

## 2018-11-07 DIAGNOSIS — I10 Essential (primary) hypertension: Secondary | ICD-10-CM | POA: Diagnosis not present

## 2018-11-08 DIAGNOSIS — J449 Chronic obstructive pulmonary disease, unspecified: Secondary | ICD-10-CM | POA: Diagnosis not present

## 2018-11-08 DIAGNOSIS — R002 Palpitations: Secondary | ICD-10-CM | POA: Diagnosis not present

## 2018-11-08 DIAGNOSIS — R0789 Other chest pain: Secondary | ICD-10-CM | POA: Diagnosis not present

## 2018-11-08 DIAGNOSIS — K219 Gastro-esophageal reflux disease without esophagitis: Secondary | ICD-10-CM | POA: Diagnosis not present

## 2018-11-08 DIAGNOSIS — E119 Type 2 diabetes mellitus without complications: Secondary | ICD-10-CM | POA: Diagnosis not present

## 2018-11-08 DIAGNOSIS — I10 Essential (primary) hypertension: Secondary | ICD-10-CM | POA: Diagnosis not present

## 2018-11-08 DIAGNOSIS — G4733 Obstructive sleep apnea (adult) (pediatric): Secondary | ICD-10-CM | POA: Diagnosis not present

## 2018-11-08 DIAGNOSIS — R69 Illness, unspecified: Secondary | ICD-10-CM | POA: Diagnosis not present

## 2018-11-12 DIAGNOSIS — R404 Transient alteration of awareness: Secondary | ICD-10-CM | POA: Diagnosis not present

## 2018-11-14 DIAGNOSIS — R404 Transient alteration of awareness: Secondary | ICD-10-CM | POA: Insufficient documentation

## 2018-11-19 ENCOUNTER — Encounter

## 2018-11-19 ENCOUNTER — Ambulatory Visit: Payer: Medicare HMO | Admitting: Cardiovascular Disease

## 2018-11-21 ENCOUNTER — Other Ambulatory Visit: Payer: Self-pay

## 2018-11-21 ENCOUNTER — Emergency Department
Admission: EM | Admit: 2018-11-21 | Discharge: 2018-11-21 | Disposition: A | Discharge status: Home / Self Care | Payer: Medicare HMO | Attending: Emergency Medicine | Admitting: Emergency Medicine

## 2018-11-21 ENCOUNTER — Encounter: Payer: Self-pay | Admitting: *Deleted

## 2018-11-21 DIAGNOSIS — T50901A Poisoning by unspecified drugs, medicaments and biological substances, accidental (unintentional), initial encounter: Secondary | ICD-10-CM

## 2018-11-21 DIAGNOSIS — Z7982 Long term (current) use of aspirin: Secondary | ICD-10-CM | POA: Insufficient documentation

## 2018-11-21 DIAGNOSIS — Z87891 Personal history of nicotine dependence: Secondary | ICD-10-CM | POA: Insufficient documentation

## 2018-11-21 DIAGNOSIS — I10 Essential (primary) hypertension: Secondary | ICD-10-CM | POA: Diagnosis not present

## 2018-11-21 DIAGNOSIS — R002 Palpitations: Secondary | ICD-10-CM | POA: Diagnosis not present

## 2018-11-21 DIAGNOSIS — T464X1A Poisoning by angiotensin-converting-enzyme inhibitors, accidental (unintentional), initial encounter: Secondary | ICD-10-CM | POA: Insufficient documentation

## 2018-11-21 DIAGNOSIS — E119 Type 2 diabetes mellitus without complications: Secondary | ICD-10-CM | POA: Insufficient documentation

## 2018-11-21 DIAGNOSIS — Z79899 Other long term (current) drug therapy: Secondary | ICD-10-CM | POA: Diagnosis not present

## 2018-11-21 LAB — BASIC METABOLIC PANEL
Anion gap: 11 (ref 5–15)
BUN: 19 mg/dL (ref 8–23)
CO2: 27 mmol/L (ref 22–32)
Calcium: 10 mg/dL (ref 8.9–10.3)
Chloride: 100 mmol/L (ref 98–111)
Creatinine, Ser: 0.9 mg/dL (ref 0.61–1.24)
GFR calc non Af Amer: >60 mL/min (ref >60)
Glucose, Bld: 131 mg/dL — ABNORMAL HIGH (ref 70–99)
Potassium: 4.1 mmol/L (ref 3.5–5.1)
Sodium: 138 mmol/L (ref 135–145)

## 2018-11-21 LAB — CBC
HCT: 50.5 % (ref 39.0–52.0)
Hemoglobin: 16.6 g/dL (ref 13.0–17.0)
MCH: 31 pg (ref 26.0–34.0)
MCHC: 32.9 g/dL (ref 30.0–36.0)
MCV: 94.4 fL (ref 80.0–100.0)
NRBC: 0 % (ref 0.0–0.2)
Platelets: 279 10*3/uL (ref 150–400)
RBC: 5.35 MIL/uL (ref 4.22–5.81)
RDW: 13.9 % (ref 11.5–15.5)
WBC: 10.4 10*3/uL (ref 4.0–10.5)

## 2018-11-21 LAB — TROPONIN I: Troponin I: <0.03 ng/mL (ref <0.03)

## 2018-11-21 NOTE — ED Provider Notes (Signed)
[facility] Emergency Department Provider Note  Time seen: 7:56 PM  I have reviewed the triage vital signs and the nursing notes.   HISTORY  Chief Complaint Drug Overdose    HPI Jeremy Hendrix is a 62 y.o. male with a past medical history of diabetes, hypertension, accidentally took 3 tablets of lisinopril today.  Patient has his medicines in a bubble pack, and the bubble pack the patient is supposed to take 1 tablet of lisinopril 20/25 hydrochlorothiazide once daily.  His new prescription is for lisinopril 20/12.5 hydrochlorothiazide to be taken twice daily.  Patient accidentally took his medications from his bubble pack as well as his new medication, for a total of 3 tablets today totaling 60 mg of lisinopril and 50 mg of hydrochlorothiazide.  Patient states this was this morning when he took the medications, began having some mild palpitations so he came to the emergency department.  Denies any chest pain or trouble breathing at any point.  Denies any nausea at any point.  Overall patient appears very well blood pressure 147/86 upon arrival.  States he is symptom-free currently.   Past Medical History:  Diagnosis Date  . Diabetes mellitus without complication ([place])    type 2  . Gout   . Hypertension   . Lateral epicondylitis of right elbow   . Pruritus   . Seborrheic dermatitis     Patient Active Problem List   Diagnosis Date Noted  . Hypogonadism in male 11/06/2017  . Morbid obesity ([place]) 06/05/2017  . Anxiety 09/10/2015  . CAFL (chronic airflow limitation) ([place]) 09/10/2015  . Diabetes ([place]) 09/10/2015  . Gout 09/10/2015  . Familial multiple lipoprotein-type hyperlipidemia 09/10/2015  . BP (high blood pressure) 09/10/2015  . Eczema intertrigo 09/10/2015  . Backhand tennis elbow 09/10/2015  . Benign neoplasm of ascending colon   . Benign neoplasm of sigmoid colon   . Breathlessness on exertion 07/02/2014  . Moderate COPD (chronic obstructive  pulmonary disease) ([place]) 07/02/2014    Past Surgical History:  Procedure Laterality Date  . CATARACT EXTRACTION, BILATERAL    . COLONOSCOPY WITH PROPOFOL N/A 07/21/2015   Procedure: COLONOSCOPY WITH PROPOFOL;  Surgeon: Lucilla Lame, MD;  Location: [facility] ENDOSCOPY;  Service: Endoscopy;  Laterality: N/A;  . HIP SURGERY      Prior to Admission medications   Medication Sig Start Date End Date Taking? Authorizing Provider  albuterol (VENTOLIN HFA) 108 (90 Base) MCG/ACT inhaler Inhale into the lungs. 08/15/16   [provider]  allopurinol (ZYLOPRIM) 100 MG tablet Take 1 tablet (100 mg total) by mouth daily. 05/10/18   Birdie Sons, MD  aspirin 325 MG tablet Take 325 mg by mouth daily.    [provider]  atorvastatin (LIPITOR) 20 MG tablet Take 20 mg by mouth daily.    [provider]  Cholecalciferol (VITAMIN D) 2000 UNITS CAPS Take by mouth.    [provider]  diltiazem (DILACOR XR) 180 MG 24 hr capsule Take 240 mg by mouth.     [provider]  fluticasone (FLONASE) 50 MCG/ACT nasal spray Place 2 sprays into both nostrils daily. 08/30/16   Carmon Ginsberg, PA  Fluticasone-Salmeterol (ADVAIR) 250-50 MCG/DOSE AEPB Inhale 1 puff into the lungs 2 (two) times daily. 05/10/18   Birdie Sons, MD  glucose blood (ONE TOUCH ULTRA TEST) test strip Use as instructed to check daily for type 2 diabetes e11.9 10/17/18   Birdie Sons, MD  INCRUSE ELLIPTA 62.5 MCG/INH AEPB  06/23/17   [provider]  ketoconazole (NIZORAL) 2 % cream Apply 1 application topically daily. 08/13/18   Birdie Sons, MD  LECITHIN PO Take by mouth.    [provider]  lisinopril-hydrochlorothiazide (PRINZIDE,ZESTORETIC) 20-25 MG tablet Take 1 tablet by mouth daily. 05/10/18   Birdie Sons, MD  mometasone (ELOCON) 0.1 % cream Apply 1 application topically daily. Apply to ear rash when occurs 10/09/17   Carmon Ginsberg, [place]  MULTIPLE VITAMINS-MINERALS PO Take by  mouth.    [provider]  Omega-3 Fatty Acids (FISH OIL) 1000 MG CAPS Take by mouth.    [provider]  saw palmetto 160 MG capsule Take 160 mg by mouth 2 (two) times daily.    [provider]  tamsulosin (FLOMAX) 0.4 MG CAPS capsule Take 1 capsule (0.4 mg total) by mouth daily. 01/26/18   Nickie Retort, MD  TAZTIA XT 240 MG 24 hr capsule  07/13/15   [provider]  vitamin E 400 UNIT capsule Take 400 Units by mouth daily.    [provider]    Allergies  Allergen Reactions  . Percocet [Oxycodone-Acetaminophen]     Hives and itching    Family History  Problem Relation Age of Onset  . Myasthenia gravis Mother   . Heart disease Father   . Healthy Sister   . Prostate cancer Neg Hx   . Bladder Cancer Neg Hx   . Kidney cancer Neg Hx     Social History Social History   Tobacco Use  . Smoking status: Former Smoker    Packs/day: 1.50    Years: 18.00    Pack years: 27.00    Types: Cigarettes  . Smokeless tobacco: Never Used  . Tobacco comment: QUIT IN 2003  Substance Use Topics  . Alcohol use: No    Alcohol/week: 0.0 standard drinks  . Drug use: No    Review of Systems Constitutional: Negative for fever. Cardiovascular: Negative for chest pain.  Palpitations. Respiratory: Negative for shortness of breath. Gastrointestinal: Negative for abdominal pain Musculoskeletal: Negative for musculoskeletal complaints Neurological: Negative for headache All other ROS negative  ____________________________________________   PHYSICAL EXAM:  VITAL SIGNS: ED Triage Vitals  Enc Vitals Group     BP 11/21/18 1702 (!) 147/86     Pulse Rate 11/21/18 1702 94     Resp 11/21/18 1702 20     Temp 11/21/18 1702 98.5 F (36.9 C)     Temp Source 11/21/18 1702 Oral     SpO2 11/21/18 1702 97 %     Weight 11/21/18 1704 (!) 365 lb (165.6 kg)     Height 11/21/18 1704 5\' 6"  (1.676 m)     Head Circumference --      Peak Flow --      Pain  Score 11/21/18 1703 0     Pain Loc --      Pain Edu? --      Excl. in [place]? --    Constitutional: Alert and oriented. Well appearing and in no distress. Eyes: Normal exam ENT   Head: Normocephalic and atraumatic.   Mouth/Throat: Mucous membranes are moist. Cardiovascular: Normal rate, regular rhythm. No murmur Respiratory: Normal respiratory effort without tachypnea nor retractions. Breath sounds are clear  Gastrointestinal: Soft and nontender. No distention.   Musculoskeletal: Nontender with normal range of motion in all extremities. Neurologic:  Normal speech and language. No gross focal neurologic deficits Skin:  Skin is warm, dry and intact.  Psychiatric:  Mood and affect are normal. Speech and behavior are normal.   ____________________________________________    EKG  EKG reviewed and interpreted by myself shows a normal sinus rhythm at 95 bpm with a slightly widened QRS, normal axis, normal intervals, nonspecific ST changes.  ____________________________________________   INITIAL IMPRESSION / ASSESSMENT AND PLAN / ED COURSE  Pertinent labs & imaging results that were available during my care of the patient were reviewed by me and considered in my medical decision making (see chart for details).  Patient presents to the emergency department for palpitations after an accidental overdose of his medications.  Currently the patient appears extremely well, no palpitations at this time, reassuring EKG, reassuring lab work.  Patient's blood pressure remains normal to hypertensive currently 147/86.  We will discharge the patient home with PCP follow-up.  ____________________________________________   FINAL CLINICAL IMPRESSION(S) / ED DIAGNOSES  Accidental overdose    Harvest Dark, MD 11/21/18 [phone]

## 2018-11-21 NOTE — ED Triage Notes (Addendum)
Pt ambulatory to triage.  Pt took 3 lisinopril at noon today by mistake   Pt is having heart palpations and dizziness.   Pt denies SI or HI.   Pt alert  Speech clear.

## 2018-11-21 NOTE — ED Notes (Signed)
Pt is ambulatory to POV without difficulty. VSS. NAD. Discharge instructions, RX and follow up reviewed. All questions and concerns addressed.

## 2018-11-22 DIAGNOSIS — I6523 Occlusion and stenosis of bilateral carotid arteries: Secondary | ICD-10-CM | POA: Diagnosis not present

## 2018-11-22 DIAGNOSIS — R404 Transient alteration of awareness: Secondary | ICD-10-CM | POA: Diagnosis not present

## 2018-11-29 DIAGNOSIS — H43813 Vitreous degeneration, bilateral: Secondary | ICD-10-CM | POA: Diagnosis not present

## 2018-11-29 DIAGNOSIS — H43391 Other vitreous opacities, right eye: Secondary | ICD-10-CM | POA: Diagnosis not present

## 2018-11-29 DIAGNOSIS — H35373 Puckering of macula, bilateral: Secondary | ICD-10-CM | POA: Diagnosis not present

## 2018-11-29 DIAGNOSIS — H35362 Drusen (degenerative) of macula, left eye: Secondary | ICD-10-CM | POA: Diagnosis not present

## 2018-12-03 ENCOUNTER — Inpatient Hospital Stay: Payer: Self-pay | Admitting: Family Medicine

## 2018-12-07 ENCOUNTER — Encounter: Payer: Self-pay | Admitting: Family Medicine

## 2018-12-07 ENCOUNTER — Ambulatory Visit (INDEPENDENT_AMBULATORY_CARE_PROVIDER_SITE_OTHER): Payer: Medicare HMO | Admitting: Family Medicine

## 2018-12-07 VITALS — BP 147/82 | HR 96 | Temp 97.9°F | Resp 16 | Wt 379.0 lb

## 2018-12-07 DIAGNOSIS — Z712 Person consulting for explanation of examination or test findings: Secondary | ICD-10-CM

## 2018-12-07 NOTE — Patient Instructions (Signed)
.   PLEASE BRING ALL OF YOUR MEDICATIONS TO EVERY APPOINTMENT TO MAKE SURE OUR MEDICATION LIST IS THE SAME AS YOURS   

## 2018-12-07 NOTE — Progress Notes (Signed)
Patient: Jeremy Hendrix Male    DOB: [birth year]   62 y.o.   MRN: [id number] Visit Date: 12/07/2018  Today's Provider: Lelon Huh, MD   Chief Complaint  Patient presents with  . Follow-up    ER Follow up  . Drug Overdose    Accidental    Subjective:     HPI   Follow up ER visit  Patient was seen in ER for Accidental overdose of lisinopril on 11/21/2018. He was treated for Accidental overdose. Treatment for this included EKG, lab work (Normal). He reports excellent compliance with treatment. He reports this condition is Improved.  The dose of lisinopril-hctz had just been changed by Dr. Gabriel Carina and he accidentally took the old dose and the new dose the same day. He had some heart palpations and was told by Poison Control to go to ER and promised to follow up with PCP. The heart palpitations are long standing and have been followed by cardiologist in [place]. He has no symptoms today  ------------------------------------------------------------------------------------   Allergies  Allergen Reactions  . Percocet [Oxycodone-Acetaminophen]     Hives and itching     Current Outpatient Medications:  .  albuterol (VENTOLIN HFA) 108 (90 Base) MCG/ACT inhaler, Inhale into the lungs., Disp: , Rfl:  .  allopurinol (ZYLOPRIM) 100 MG tablet, Take 1 tablet (100 mg total) by mouth daily., Disp: 90 tablet, Rfl: 4 .  aspirin 325 MG tablet, Take 325 mg by mouth daily., Disp: , Rfl:  .  atorvastatin (LIPITOR) 20 MG tablet, Take 20 mg by mouth daily., Disp: , Rfl:  .  b complex vitamins capsule, Take 1 capsule by mouth daily., Disp: , Rfl:  .  Cholecalciferol (VITAMIN D) 2000 UNITS CAPS, Take by mouth., Disp: , Rfl:  .  fluticasone (FLONASE) 50 MCG/ACT nasal spray, Place 2 sprays into both nostrils daily., Disp: 16 g, Rfl: 0 .  Fluticasone-Salmeterol (ADVAIR) 250-50 MCG/DOSE AEPB, Inhale 1 puff into the lungs 2 (two) times daily., Disp: 60 each, Rfl: 4 .  glucose blood (ONE TOUCH  ULTRA TEST) test strip, Use as instructed to check daily for type 2 diabetes e11.9, Disp: 100 each, Rfl: 3 .  INCRUSE ELLIPTA 62.5 MCG/INH AEPB, , Disp: , Rfl:  .  ketoconazole (NIZORAL) 2 % cream, Apply 1 application topically daily., Disp: 90 g, Rfl: 2 .  LECITHIN PO, Take by mouth., Disp: , Rfl:  .  lisinopril-hydrochlorothiazide (PRINZIDE,ZESTORETIC) 20-12.5 MG tablet, Take 2 tablets by mouth daily., Disp: , Rfl: 3 .  mometasone (ELOCON) 0.1 % cream, Apply 1 application topically daily. Apply to ear rash when occurs, Disp: 30 g, Rfl: 0 .  Omega-3 Fatty Acids (FISH OIL) 1000 MG CAPS, Take by mouth., Disp: , Rfl:  .  tamsulosin (FLOMAX) 0.4 MG CAPS capsule, Take 1 capsule (0.4 mg total) by mouth daily., Disp: 30 capsule, Rfl: 11 .  TAZTIA XT 240 MG 24 hr capsule, , Disp: , Rfl:  .  vitamin E 400 UNIT capsule, Take 400 Units by mouth daily., Disp: , Rfl:  .  lisinopril-hydrochlorothiazide (PRINZIDE,ZESTORETIC) 20-25 MG tablet, Take 1 tablet by mouth daily., Disp: 90 tablet, Rfl: 4 .  MULTIPLE VITAMINS-MINERALS PO, Take by mouth., Disp: , Rfl:  .  saw palmetto 160 MG capsule, Take 160 mg by mouth 2 (two) times daily., Disp: , Rfl:   Review of Systems  Constitutional: Negative.   Respiratory: Negative.   Cardiovascular: Positive for palpitations. Negative for chest pain and leg  swelling (Occasionally).  Gastrointestinal: Negative.   Neurological: Negative for dizziness, light-headedness and headaches.    Social History   Tobacco Use  . Smoking status: Former Smoker    Packs/day: 1.50    Years: 18.00    Pack years: 27.00    Types: Cigarettes  . Smokeless tobacco: Never Used  . Tobacco comment: QUIT IN 2003  Substance Use Topics  . Alcohol use: No    Alcohol/week: 0.0 standard drinks      Objective:   BP (!) 147/82 (BP Location: Right Arm, Patient Position: Sitting, Cuff Size: Large)   Pulse 96   Temp 97.9 F (36.6 C) (Oral)   Resp 16   Wt (!) 379 lb (171.9 kg)   BMI 61.17  kg/m  Vitals:   12/07/18 0824  BP: (!) 147/82  Pulse: 96  Resp: 16  Temp: 97.9 F (36.6 C)  TempSrc: Oral  Weight: (!) 379 lb (171.9 kg)           Assessment & Plan    Patient accidentally took one extra lisinopril-hctz the day he went to ER. Is now taking consistently as prescribed by Dr. Nilda Simmer and having no symptoms to evaluated today.   He did inquire about results of carotid u/s done at [facility] earlier this month. Will try to obtain a copy of this report.     Lelon Huh, MD  [facility]

## 2018-12-09 ENCOUNTER — Encounter: Payer: Self-pay | Admitting: Emergency Medicine

## 2018-12-09 ENCOUNTER — Emergency Department
Admission: EM | Admit: 2018-12-09 | Discharge: 2018-12-09 | Disposition: A | Discharge status: Home / Self Care | Payer: Medicare HMO | Attending: Emergency Medicine | Admitting: Emergency Medicine

## 2018-12-09 ENCOUNTER — Other Ambulatory Visit: Payer: Self-pay

## 2018-12-09 ENCOUNTER — Emergency Department: Payer: Medicare HMO

## 2018-12-09 DIAGNOSIS — S61111A Laceration without foreign body of right thumb with damage to nail, initial encounter: Secondary | ICD-10-CM

## 2018-12-09 DIAGNOSIS — I10 Essential (primary) hypertension: Secondary | ICD-10-CM | POA: Diagnosis not present

## 2018-12-09 DIAGNOSIS — Z79899 Other long term (current) drug therapy: Secondary | ICD-10-CM | POA: Insufficient documentation

## 2018-12-09 DIAGNOSIS — Y998 Other external cause status: Secondary | ICD-10-CM | POA: Insufficient documentation

## 2018-12-09 DIAGNOSIS — Y9389 Activity, other specified: Secondary | ICD-10-CM | POA: Insufficient documentation

## 2018-12-09 DIAGNOSIS — F419 Anxiety disorder, unspecified: Secondary | ICD-10-CM | POA: Insufficient documentation

## 2018-12-09 DIAGNOSIS — W312XXA Contact with powered woodworking and forming machines, initial encounter: Secondary | ICD-10-CM | POA: Diagnosis not present

## 2018-12-09 DIAGNOSIS — Y929 Unspecified place or not applicable: Secondary | ICD-10-CM | POA: Diagnosis not present

## 2018-12-09 DIAGNOSIS — E119 Type 2 diabetes mellitus without complications: Secondary | ICD-10-CM | POA: Insufficient documentation

## 2018-12-09 DIAGNOSIS — S62522B Displaced fracture of distal phalanx of left thumb, initial encounter for open fracture: Secondary | ICD-10-CM | POA: Diagnosis not present

## 2018-12-09 DIAGNOSIS — Z7982 Long term (current) use of aspirin: Secondary | ICD-10-CM | POA: Insufficient documentation

## 2018-12-09 DIAGNOSIS — J449 Chronic obstructive pulmonary disease, unspecified: Secondary | ICD-10-CM | POA: Diagnosis not present

## 2018-12-09 DIAGNOSIS — S61011A Laceration without foreign body of right thumb without damage to nail, initial encounter: Secondary | ICD-10-CM | POA: Diagnosis not present

## 2018-12-09 DIAGNOSIS — Z87891 Personal history of nicotine dependence: Secondary | ICD-10-CM | POA: Insufficient documentation

## 2018-12-09 DIAGNOSIS — R69 Illness, unspecified: Secondary | ICD-10-CM | POA: Diagnosis not present

## 2018-12-09 DIAGNOSIS — S62521A Displaced fracture of distal phalanx of right thumb, initial encounter for closed fracture: Secondary | ICD-10-CM | POA: Diagnosis not present

## 2018-12-09 MED ORDER — LIDOCAINE HCL (PF) 1 % IJ SOLN
5.0000 mL | Freq: Once | INTRAMUSCULAR | Status: AC
  Administered 2018-12-09: 5 mL
  Filled 2018-12-09: qty 5

## 2018-12-09 MED ORDER — BUPIVACAINE HCL (PF) 0.5 % IJ SOLN
50.0000 mL | Freq: Once | INTRAMUSCULAR | Status: DC
  Filled 2018-12-09: qty 60

## 2018-12-09 MED ORDER — TRAMADOL HCL 50 MG PO TABS: 50.0000 mg | ORAL_TABLET | Freq: Three times a day (TID) | ORAL | 0 refills | Status: AC | PRN

## 2018-12-09 MED ORDER — SULFAMETHOXAZOLE-TRIMETHOPRIM 800-160 MG PO TABS: 1.0000 | ORAL_TABLET | Freq: Once | ORAL | Status: DC

## 2018-12-09 MED ORDER — SULFAMETHOXAZOLE-TRIMETHOPRIM 800-160 MG PO TABS: 1.0000 | ORAL_TABLET | Freq: Two times a day (BID) | ORAL | 0 refills | Status: DC

## 2018-12-09 MED ORDER — BUPIVACAINE HCL 0.5 % IJ SOLN
30.0000 mL | Freq: Once | INTRAMUSCULAR | Status: AC
  Administered 2018-12-09: 30 mL
  Filled 2018-12-09: qty 30

## 2018-12-09 NOTE — ED Provider Notes (Signed)
[facility] Emergency Department Provider Note ____________________________________________  Time seen: 1511  I have reviewed the triage vital signs and the nursing notes.  HISTORY  Chief Complaint  Laceration  HPI Jeremy Hendrix is a 62 y.o. male who presents himself to the ED for evaluation of a an accidental laceration to his right thumb.  Patient describes he was using a circular saw, to make slats for a door, when he accidentally touched the blade.  He sustained a large laceration across the thumb at the fat pad, nearly avulsing his fingertip.  He presents now for further evaluation.  He reports a current tetanus status.  No other injuries reported at this time.  Past Medical History:  Diagnosis Date  . Diabetes mellitus without complication ([place])    type 2  . Gout   . Hypertension   . Lateral epicondylitis of right elbow   . Pruritus   . Seborrheic dermatitis     Patient Active Problem List   Diagnosis Date Noted  . Hypogonadism in male 11/06/2017  . Morbid obesity ([place]) 06/05/2017  . Anxiety 09/10/2015  . CAFL (chronic airflow limitation) ([place]) 09/10/2015  . Diabetes ([place]) 09/10/2015  . Gout 09/10/2015  . Familial multiple lipoprotein-type hyperlipidemia 09/10/2015  . BP (high blood pressure) 09/10/2015  . Eczema intertrigo 09/10/2015  . Backhand tennis elbow 09/10/2015  . Benign neoplasm of ascending colon   . Benign neoplasm of sigmoid colon   . Breathlessness on exertion 07/02/2014  . Moderate COPD (chronic obstructive pulmonary disease) ([place]) 07/02/2014    Past Surgical History:  Procedure Laterality Date  . CATARACT EXTRACTION, BILATERAL    . COLONOSCOPY WITH PROPOFOL N/A 07/21/2015   Procedure: COLONOSCOPY WITH PROPOFOL;  Surgeon: Lucilla Lame, MD;  Location: [facility] ENDOSCOPY;  Service: Endoscopy;  Laterality: N/A;  . HIP SURGERY      Prior to Admission medications   Medication Sig Start Date End Date Taking? Authorizing Provider   albuterol (VENTOLIN HFA) 108 (90 Base) MCG/ACT inhaler Inhale into the lungs. 08/15/16   [provider]  allopurinol (ZYLOPRIM) 100 MG tablet Take 1 tablet (100 mg total) by mouth daily. 05/10/18   Birdie Sons, MD  aspirin 325 MG tablet Take 325 mg by mouth daily.    [provider]  atorvastatin (LIPITOR) 20 MG tablet Take 20 mg by mouth daily.    [provider]  b complex vitamins capsule Take 1 capsule by mouth daily.    [provider]  Cholecalciferol (VITAMIN D) 2000 UNITS CAPS Take by mouth.    [provider]  fluticasone (FLONASE) 50 MCG/ACT nasal spray Place 2 sprays into both nostrils daily. 08/30/16   Carmon Ginsberg, PA  Fluticasone-Salmeterol (ADVAIR) 250-50 MCG/DOSE AEPB Inhale 1 puff into the lungs 2 (two) times daily. 05/10/18   Birdie Sons, MD  glucose blood (ONE TOUCH ULTRA TEST) test strip Use as instructed to check daily for type 2 diabetes e11.9 10/17/18   Birdie Sons, MD  INCRUSE ELLIPTA 62.5 MCG/INH AEPB  06/23/17   [provider]  ketoconazole (NIZORAL) 2 % cream Apply 1 application topically daily. 08/13/18   Birdie Sons, MD  LECITHIN PO Take by mouth.    [provider]  lisinopril-hydrochlorothiazide (PRINZIDE,ZESTORETIC) 20-12.5 MG tablet Take 2 tablets by mouth daily. 11/08/18   [provider]  lisinopril-hydrochlorothiazide (PRINZIDE,ZESTORETIC) 20-25 MG tablet Take 1 tablet by mouth daily. 05/10/18   Birdie Sons, MD  mometasone (ELOCON) 0.1 % cream  Apply 1 application topically daily. Apply to ear rash when occurs 10/09/17   Carmon Ginsberg, [place]  MULTIPLE VITAMINS-MINERALS PO Take by mouth.    [provider]  Omega-3 Fatty Acids (FISH OIL) 1000 MG CAPS Take by mouth.    [provider]  saw palmetto 160 MG capsule Take 160 mg by mouth 2 (two) times daily.    [provider]  sulfamethoxazole-trimethoprim (BACTRIM DS,SEPTRA DS) 800-160 MG tablet  Take 1 tablet by mouth 2 (two) times daily. 12/09/18   Sheli Dorin, Dannielle Karvonen, PA-C  tamsulosin (FLOMAX) 0.4 MG CAPS capsule Take 1 capsule (0.4 mg total) by mouth daily. 01/26/18   Nickie Retort, MD  TAZTIA XT 240 MG 24 hr capsule  07/13/15   [provider]  traMADol (ULTRAM) 50 MG tablet Take 1 tablet (50 mg total) by mouth 3 (three) times daily as needed for up to 5 days. 12/09/18 12/14/18  Sajid Ruppert, Dannielle Karvonen, PA-C  vitamin E 400 UNIT capsule Take 400 Units by mouth daily.    [provider]   Allergies Percocet [oxycodone-acetaminophen]  Family History  Problem Relation Age of Onset  . Myasthenia gravis Mother   . Heart disease Father   . Healthy Sister   . Prostate cancer Neg Hx   . Bladder Cancer Neg Hx   . Kidney cancer Neg Hx     Social History Social History   Tobacco Use  . Smoking status: Former Smoker    Packs/day: 1.50    Years: 18.00    Pack years: 27.00    Types: Cigarettes  . Smokeless tobacco: Never Used  . Tobacco comment: QUIT IN 2003  Substance Use Topics  . Alcohol use: No    Alcohol/week: 0.0 standard drinks  . Drug use: No    Review of Systems  Constitutional: Negative for fever. Cardiovascular: Negative for chest pain. Respiratory: Negative for shortness of breath. Gastrointestinal: Negative for abdominal pain, vomiting and diarrhea. Musculoskeletal: Negative for back pain. Right thumb laceration as above.  Skin: Negative for rash. Neurological: Negative for headaches, focal weakness or numbness. ____________________________________________  PHYSICAL EXAM:  VITAL SIGNS: ED Triage Vitals  Enc Vitals Group     BP 12/09/18 1438 (!) 166/82     Pulse Rate 12/09/18 1438 (!) 114     Resp 12/09/18 1438 (!) 22     Temp 12/09/18 1438 98.7 F (37.1 C)     Temp Source 12/09/18 1438 Oral     SpO2 12/09/18 1438 96 %     Weight 12/09/18 1439 (!) 365 lb (165.6 kg)     Height 12/09/18 1439 5\' 7"  (1.702 m)     Head  Circumference --      Peak Flow --      Pain Score 12/09/18 1438 3     Pain Loc --      Pain Edu? --      Excl. in [place]? --     Constitutional: Alert and oriented. Well appearing and in no distress. Head: Normocephalic and atraumatic. Eyes: Conjunctivae are normal. Normal extraocular movements Cardiovascular: Normal rate, regular rhythm. Normal distal pulses. Respiratory: Normal respiratory effort.  Musculoskeletal: Nontender with normal range of motion in all extremities.  Neurologic:  Normal gait without ataxia. Normal speech and language. No gross focal neurologic deficits are appreciated. Skin:  Skin is warm, dry and intact. No rash noted. ____________________________________________   RADIOLOGY  Right Thumb  FINDINGS: Soft tissue and bone injury of the distal thumb.  Extensively comminuted fracture of the distal tuft, possibly with missing portions. Fracture lines do not extend to the inter phalangeal joint. ____________________________________________  PROCEDURES  .Marland KitchenLaceration Repair Date/Time: 12/09/2018 7:42 PM Performed by: Melvenia Needles, PA-C Authorized by: Melvenia Needles, PA-C   Consent:    Consent obtained:  Verbal   Consent given by:  Patient   Risks discussed:  Pain, poor wound healing and need for additional repair   Alternatives discussed:  Referral Anesthesia (see MAR for exact dosages):    Anesthesia method:  Nerve block   Block location:  Digital nerves   Block needle gauge:  27 G   Block anesthetic:  Lidocaine 1% w/o epi and bupivacaine 0.5% w/o epi (2:1 Bupivicaine:Lidocaine)   Block technique:  Transthecal & digital   Block injection procedure:  Anatomic landmarks palpated, introduced needle and incremental injection   Block outcome:  Anesthesia achieved Laceration details:    Location:  Finger   Finger location:  R thumb   Length (cm):  5   Depth (mm):  5 Repair type:    Repair type:  Simple Pre-procedure details:     Preparation:  Patient was prepped and draped in usual sterile fashion Exploration:    Contaminated: no   Treatment:    Area cleansed with:  Betadine and saline   Amount of cleaning:  Standard   Irrigation solution:  Sterile saline   Irrigation method:  Syringe Skin repair:    Repair method:  Sutures   Suture size:  4-0   Suture material:  Nylon   Suture technique:  Simple interrupted   Number of sutures:  5 Approximation:    Approximation:  Loose Post-procedure details:    Dressing:  Non-adherent dressing, bulky dressing and splint for protection   Patient tolerance of procedure:  Tolerated well, no immediate complications   See Media Images 12/09/18 ____________________________________________  INITIAL IMPRESSION / ASSESSMENT AND PLAN / ED COURSE  ----------------------------------------- 4:01 PM on 12/09/2018 -----------------------------------------  S/w Dr. Rudene Christians: he suggests loose closure and non-stick dressing, splint, and antibiotics. He will see the patient in the office next week for further wound management.   Patient with initial fracture management and wound care management of an open distal tuft fracture and near avulsion of the distal tip of the thumb.  The patient's right thumb laceration was approximated loosely and dressed in a bulky nonadherent dressing.  Patient is discharged with a prescription for Bactrim as well as a prescription for Ultram.  He is to follow-up with Dr. Chipper Herb for further fracture care and wound management.  Patient verbalizes understanding and is discharged to his own care.  I reviewed the patient's prescription history over the last 12 months in the multi-state controlled substances database(s) that includes [place], [place], [place], [place], [place], [place], [place], [place], [place], [place], [place], [place], [place], and [place].  Results were notable for no prescription history.   ____________________________________________  FINAL CLINICAL IMPRESSION(S) / ED DIAGNOSES  Final diagnoses:  Laceration of right thumb without foreign body with damage to nail, initial encounter  Open fracture of tuft of distal phalanx of left thumb      Carmie End, Dannielle Karvonen, PA-C 12/09/18 1947    Arta Silence, MD 12/09/18 2243

## 2018-12-09 NOTE — Discharge Instructions (Signed)
You have had your thumb laceration repaired with sutures. Keep the wound clean, dry, and covered. Use the splint to prevent disruption of fracture fragments. Take the antibiotic as directed and the pain medicine as needed. Follow-up with Dr. Rudene Christians next week, as discussed.

## 2018-12-09 NOTE — ED Triage Notes (Signed)
Near complete avulsion tip R thumb, cut with circular saw just prior to arrival. Bleeding controlled, gauze applied.

## 2018-12-11 DIAGNOSIS — S62521B Displaced fracture of distal phalanx of right thumb, initial encounter for open fracture: Secondary | ICD-10-CM | POA: Diagnosis not present

## 2018-12-14 DIAGNOSIS — R569 Unspecified convulsions: Secondary | ICD-10-CM | POA: Diagnosis not present

## 2018-12-17 DIAGNOSIS — R569 Unspecified convulsions: Secondary | ICD-10-CM | POA: Insufficient documentation

## 2018-12-21 DIAGNOSIS — S62639B Displaced fracture of distal phalanx of unspecified finger, initial encounter for open fracture: Secondary | ICD-10-CM | POA: Diagnosis not present

## 2018-12-24 ENCOUNTER — Telehealth: Payer: Self-pay | Admitting: Family Medicine

## 2018-12-24 NOTE — Telephone Encounter (Signed)
Form faxed again today.   Thanks,   -Mickel Baas

## 2018-12-24 NOTE — Telephone Encounter (Signed)
Patient had ov. On 12-07-2018 and we sent request to [facility] for results of carotid dopplers done on 12-5. I still haven't seen results. Can you check with [facility] to see if they have tried to send it.

## 2018-12-27 NOTE — Telephone Encounter (Signed)
Spoke to neurology and they were faxing the reports over.  Spoke to [place] at [phone].  dbs

## 2019-01-01 NOTE — Telephone Encounter (Signed)
Can you please advise patient we finally got copy of carotid ultrasound report from [facility] and it was normal. There were a few very small plaques which is normal.

## 2019-01-02 DIAGNOSIS — S62521B Displaced fracture of distal phalanx of right thumb, initial encounter for open fracture: Secondary | ICD-10-CM | POA: Diagnosis not present

## 2019-01-02 NOTE — Telephone Encounter (Signed)
Pt advised.  He was wanting to know what the next step is.  He is still having a lot of "Congentive issues"  So far the MRI, carotid, and EEG are normal.    Please advise.    Thanks,   -Mickel Baas

## 2019-01-05 NOTE — Telephone Encounter (Signed)
encounter opened in error, closed for administrative reasons.

## 2019-01-18 DIAGNOSIS — S62639B Displaced fracture of distal phalanx of unspecified finger, initial encounter for open fracture: Secondary | ICD-10-CM | POA: Diagnosis not present

## 2019-01-21 ENCOUNTER — Encounter: Payer: Self-pay | Admitting: Urology

## 2019-01-21 ENCOUNTER — Ambulatory Visit (INDEPENDENT_AMBULATORY_CARE_PROVIDER_SITE_OTHER): Payer: Medicare HMO | Admitting: Urology

## 2019-01-21 VITALS — BP 151/91 | HR 105 | Ht 68.0 in | Wt >= 6400 oz

## 2019-01-21 DIAGNOSIS — N401 Enlarged prostate with lower urinary tract symptoms: Secondary | ICD-10-CM | POA: Diagnosis not present

## 2019-01-21 DIAGNOSIS — R3916 Straining to void: Secondary | ICD-10-CM | POA: Diagnosis not present

## 2019-01-21 DIAGNOSIS — R7989 Other specified abnormal findings of blood chemistry: Secondary | ICD-10-CM

## 2019-01-21 MED ORDER — TAMSULOSIN HCL 0.4 MG PO CAPS: 0.4000 mg | ORAL_CAPSULE | Freq: Every day | ORAL | 11 refills | Status: DC

## 2019-01-21 NOTE — Progress Notes (Addendum)
   01/21/2019 3:14 PM   ARUL FARABEE [birth year] [id number]  Reason for visit: Follow up BPH/LUTS, low T  HPI: I saw Mr. Prieto in urology clinic for the first time today for discussion of BPH/LUTS and low testosterone.  He was previously followed by Dr. Pilar Jarvis and by Dr. Junious Silk.  He has multiple co-morbidities including morbid obesity, diabetes, COPD, and hypertension.  He is on a keto diet and is exercising regularly, and has lost 45 pounds over the last few years, however he feels his metabolism from low testosterone is preventing him from more significant weight loss.  He was previously on topical testosterone replacement by his primary care physician, however he felt this made him very irritable and he discontinued therapy.  Regarding his BPH, he reports weak stream, nocturia 1-2 times per night, and urinary frequency.  He has been on Flomax with improvement in the symptoms.  He denies any gross hematuria, flank pain, or history of urinary retention.  ROS: Please see flowsheet from today's date for complete review of systems.  Physical Exam: BP (!) 151/91   Pulse (!) 105   Ht 5\' 8"  (1.727 m)   Wt (!) 450 lb (204.1 kg) Comment: patient reported  BMI 68.42 kg/m    Constitutional:  Alert and oriented, No acute distress.  Obese Respiratory: Normal respiratory effort, no increased work of breathing. GI: Abdomen is soft, nontender, nondistended, no abdominal masses GU: No CVA tenderness DRE: Deferred Skin: No rashes, bruises or suspicious lesions. Neurologic: Grossly intact, no focal deficits, moving all 4 extremities. Psychiatric: Normal mood and affect  Laboratory Data: Testosterone history 11/2017: 179 10/2017: 194 10/2017: 204  PSA 07/2018: 2.3  Pertinent Imaging: None to review  Assessment & Plan:   In summary, the patient is a 63 year old morbidly obese male with multiple co-morbidities including diabetes, COPD, and hypertension.  He has modified a number of  lifestyle factors including healthy eating, tight diabetes management, and exercising, and is still having difficulty losing weight which he attributes to his low testosterone.  We discussed the new AUA guidelines that recommend two morning testosterone values below 300 prior to initiating testosterone therapy.  He does have a history of multiple testosterone is less than 200.  We discussed the controversies surrounding testosterone replacement including possible increased risk of vascular events including stroke and heart attack.  Despite the risks, the patient is still very interested in pursuing testosterone replacement therapy secondary to his morbid obesity and difficulty with weight loss.  -AM testosterone and CBC ordered -RTC with Zara Council, PA to review above labs and for likely initiation of testosterone injection therapy and close monitoring -Flomax refilled  A total of 15 minutes were spent face-to-face with the patient, greater than 50% was spent in patient education, counseling, and coordination of care regarding BPH, low testosterone, and controversy surrounding testosterone replacement.  B[place]ley [place], [facility] [address] [place], [place] [address] [phone]

## 2019-01-21 NOTE — Patient Instructions (Signed)
Testosterone Replacement Therapy  Testosterone replacement therapy (TRT) is used to treat men who have a low testosterone level (hypogonadism). Testosterone is a male hormone that is produced in the testicles. It is responsible for typically male characteristics and for maintaining a man's sex drive and the ability to get an erection. Testosterone also supports bone and muscle health. TRT can be a gel, liquid, or patch that you put on your skin. It can also be in the form of a tablet or an injection. In some cases, your health care provider may insert long-acting pellets under your skin. In most men, the level of testosterone starts to decline gradually after age 58. Low testosterone can also be caused by certain medical conditions, medicines, and obesity. Your health care provider can diagnose hypogonadism with at least two blood tests that are done early in the morning. Low testosterone may not need to be treated. TRT is usually a choice that you make with your health care provider. Your health care provider may recommend TRT if you have low testosterone that is causing symptoms, such as:  Low sex drive.  Erection problems.  Breast enlargement.  Loss of body hair.  Weak muscles or bones.  Shrinking testicles.  Increased body fat.  Low energy.  Hot flashes.  Depression.  Decreased work Systems analyst. TRT is a lifetime treatment. If you stop treatment, your testosterone will drop, and your symptoms may return. What are the risks? Testosterone replacement therapy may have side effects, including:  Lower sperm count.  Skin irritation at the application or injection site.  Mouth irritation if you take an oral tablet.  Acne.  Swelling of your legs or feet.  Tender breasts.  Dizziness.  Sleep disturbance.  Mood swings.  Possible increased risk of stroke or heart attack. Testosterone replacement therapy may also increase your risk for prostate cancer or male breast cancer.  You should not use TRT if you have either of those conditions. Your health care provider also may not recommend TRT if:  You are suspected of having prostate cancer.  You want to father a child.  You have a high number of red blood cells.  You have untreated sleep apnea.  You have a very large prostate. Supplies needed:  Your health care provider will prescribe the testosterone gel, solution, or medicine that you need. If your health care provider teaches you to do self-injections at home, you will also need: ? Your medicine vial. ? Disposable needles and syringes. ? Alcohol swabs. ? A needle disposal container. ? Adhesive bandages. How to use testosterone replacement therapy Your health care provider will help you find the TRT option that will work best for you based on your preference, the side effects, and the cost. You may:  Rub testosterone gel on your upper arm or shoulder every day after a shower. This is the most common type of TRT. Do not let women or children come in contact with the gel.  Apply a testosterone solution under your arms once each day.  Place a testosterone patch on your skin once each day.  Dissolve a testosterone tablet in your mouth twice each day.  Have a testosterone pellet inserted under your skin by your health care provider. This will be replaced every 3-6 months.  Use testosterone nasal spray three times each day.  Get testosterone injections. For some types of testosterone, your health care provider will give you this injection. With other types of testosterone, you may be taught to give injections to  provider. This will be replaced every 3-6 months.  · Use testosterone nasal spray three times each day.  · Get testosterone injections. For some types of testosterone, your health care provider will give you this injection. With other types of testosterone, you may be taught to give injections to yourself. The frequency of injections may vary based on the type of testosterone that you receive.  Follow these instructions at home:  · Take over-the-counter and prescription medicines only as told by your health care provider.  · Lose weight if you are overweight. Ask your health care provider to help you start a healthy diet and exercise program to  reach and maintain a healthy weight.  · Work with your health care provider to treat other medical conditions that may lower your testosterone. These include obesity, high blood pressure, high cholesterol, diabetes, liver disease, kidney disease, and sleep apnea.  · Keep all follow-up visits as told by your health care provider. This is important.  General recommendations  · Discuss all risks and benefits with your health care provider before starting therapy.  · Work with your health care provider to check your prostate health and do blood testing before you start therapy.  · Do not use any testosterone replacement therapies that are not prescribed by your health care provider or not approved for use in the [place]  · Do not use TRT for bodybuilding or to improve sexual performance. TRT should be used only to treat symptoms of low testosterone.  · Return for all repeat prostate checks and blood tests during therapy, as told by your health care provider.  Where to find more information  Learn more about testosterone replacement therapy from:  · American Urological Foundation: www.urologyhealth.org/urologic-conditions/low-testosterone-(hypogonadism)  · Endocrine Society: www.hormone.org/diseases-and-conditions/mens-health/hypogonadism  Contact a health care provider if:  · You have side effects from your testosterone replacement therapy.  · You continue to have symptoms of low testosterone during treatment.  · You develop new symptoms during treatment.  Summary  · Testosterone replacement therapy is only for men who have low testosterone as determined by blood testing and who have symptoms of low testosterone.  · Testosterone replacement therapy should be prescribed only by a health care provider and should be used under the supervision of a health care provider.  · You may not be able to take testosterone if you have certain medical conditions, including prostate cancer, male breast cancer, or heart  disease.  · Testosterone replacement therapy may have side effects and may make some medical conditions worse.  · Talk with your health care provider about all the risks and benefits before you start therapy.  This information is not intended to replace advice given to you by your health care provider. Make sure you discuss any questions you have with your health care provider.  Document Released: 08/25/2016 Document Revised: 08/25/2016 Document Reviewed: 08/25/2016  Elsevier Interactive Patient Education © 2019 [employer]

## 2019-01-30 DIAGNOSIS — E119 Type 2 diabetes mellitus without complications: Secondary | ICD-10-CM | POA: Diagnosis not present

## 2019-01-30 DIAGNOSIS — R635 Abnormal weight gain: Secondary | ICD-10-CM | POA: Diagnosis not present

## 2019-01-30 DIAGNOSIS — I10 Essential (primary) hypertension: Secondary | ICD-10-CM | POA: Diagnosis not present

## 2019-02-08 DIAGNOSIS — S62639B Displaced fracture of distal phalanx of unspecified finger, initial encounter for open fracture: Secondary | ICD-10-CM | POA: Diagnosis not present

## 2019-02-12 ENCOUNTER — Other Ambulatory Visit: Payer: Medicare HMO

## 2019-02-12 DIAGNOSIS — R7989 Other specified abnormal findings of blood chemistry: Secondary | ICD-10-CM

## 2019-02-12 NOTE — Progress Notes (Signed)
02/13/2019 2:41 PM   Jeremy Hendrix [birth year] [id number]  Referring provider: Birdie Sons, [address], [place] [address]  Chief Complaint  Patient presents with  . Benign Prostatic Hypertrophy  . Low Testosterone    HPI: Jeremy Hendrix is a 63 y.o. male Caucasian with DM, COPD, HTN, gout, BPH/LUTS, unhealthy weight, and hypogonadism who presents today to discuss starting testosterone replacement therapy.  He has modified a number of lifestyle factors, including healthy eating, tight diabetes management, and exercising, but has had difficulty losing weight.  He believes, however, that his metabolism from low testosterone is preventing more significant weight loss.  He has previously been on topical testosterone replacement (Androderm 10/2017, switched to Testim 12/2017) by his PCP, but he felt this made him very irritable and he had discontinued that therapy.  He is interested in injections due to the better ability to properly adjust the dose.  He says his PCP did not check his testosterone, and he ceased taking the topical testosterone within 3 weeks due to how extremely irritable it made him.  BPH WITH LUTS  (prostate and/or bladder) IPSS score: 11/2  Denies any dysuria, hematuria or suprapubic pain.  Denies any recent fevers, chills, nausea or vomiting.  He does not have a family history of PCa.  IPSS    Row Name 02/13/19 1300         International Prostate Symptom Score   How often have you had the sensation of not emptying your bladder?  Less than 1 in 5     How often have you had to urinate less than every two hours?  About half the time     How often have you found you stopped and started again several times when you urinated?  Less than half the time     How often have you found it difficult to postpone urination?  More than half the time     How often have you had a weak urinary stream?  Not at All     How often have you had to  strain to start urination?  Not at All     How many times did you typically get up at night to urinate?  1 Time     Total IPSS Score  11       Quality of Life due to urinary symptoms   If you were to spend the rest of your life with your urinary condition just the way it is now how would you feel about that?  Mostly Satisfied        Score:  1-7 Mild 8-19 Moderate 20-35 Severe  Erectile dysfunction His SHIM score is 5, which is severe ED.  He is not interested in treatment at this time.  SHIM    Row Name 02/13/19 1348         SHIM: Over the last 6 months:   How do you rate your confidence that you could get and keep an erection?  Very Low     When you had erections with sexual stimulation, how often were your erections hard enough for penetration (entering your partner)?  Almost Never or Never     During sexual intercourse, how often were you able to maintain your erection after you had penetrated (entered) your partner?  Almost Never or Never     During sexual intercourse, how difficult was it to maintain your erection to completion of intercourse?  Extremely Difficult  When you attempted sexual intercourse, how often was it satisfactory for you?  Almost Never or Never       SHIM Total Score   SHIM  5        Score: 1-7 Severe ED 8-11 Moderate ED 12-16 Mild-Moderate ED 17-21 Mild ED 22-25 No ED  Testosterone deficiency Patient is experiencing a decrease in libido, a lack of energy, a decrease in strength, a loss in height, falling asleep after dinner and less strong erections.  This is indicated by his responses to the ADAM questionnaire.  He does not have sleep apnea.  He is reporting obesity, moderate to severe chronic obstructive lung disease, and Type 2 diabetes mellitus.  His pretreatment testosterone level was 257 nmol/L.   Androgen Deficiency in the Aging Male    [facility] [place] Name 02/13/19 1300         Androgen Deficiency in the Aging Male   Do you have a decrease  in libido (sex drive)  Yes     Do you have lack of energy  Yes     Do you have a decrease in strength and/or endurance  Yes     Have you lost height  No     Have you noticed a decreased "enjoyment of life"  No     Are you sad and/or grumpy  Yes     Are your erections less strong  No     Have you noticed a recent deterioration in your ability to play sports  Yes     Are you falling asleep after dinner  No        PMH: Past Medical History:  Diagnosis Date  . Diabetes mellitus without complication ([place])    type 2  . Gout   . Hypertension   . Lateral epicondylitis of right elbow   . Pruritus   . Seborrheic dermatitis     Surgical History: Past Surgical History:  Procedure Laterality Date  . CATARACT EXTRACTION, BILATERAL    . COLONOSCOPY WITH PROPOFOL N/A 07/21/2015   Procedure: COLONOSCOPY WITH PROPOFOL;  Surgeon: Lucilla Lame, MD;  Location: [facility] ENDOSCOPY;  Service: Endoscopy;  Laterality: N/A;  . HIP SURGERY      Home Medications:  Allergies as of 02/13/2019      Reactions   Percocet [oxycodone-acetaminophen]    Hives and itching      Medication List       Accurate as of February 13, 2019  2:41 PM. Always use your most recent med list.        allopurinol 100 MG tablet Commonly known as:  ZYLOPRIM Take 1 tablet (100 mg total) by mouth daily.   aspirin 325 MG tablet Take 325 mg by mouth daily.   atorvastatin 20 MG tablet Commonly known as:  LIPITOR Take 20 mg by mouth daily.   b complex vitamins capsule Take 1 capsule by mouth daily.   Fish Oil 1000 MG Caps Take by mouth.   fluticasone 50 MCG/ACT nasal spray Commonly known as:  FLONASE Place 2 sprays into both nostrils daily.   Fluticasone-Salmeterol 250-50 MCG/DOSE Aepb Commonly known as:  ADVAIR Inhale 1 puff into the lungs 2 (two) times daily.   glucose blood test strip Commonly known as:  ONE TOUCH ULTRA TEST Use as instructed to check daily for type 2 diabetes e11.9   INCRUSE ELLIPTA 62.5  MCG/INH Aepb Generic drug:  umeclidinium bromide   ketoconazole 2 % cream Commonly known as:  NIZORAL Apply 1 application  topically daily.   LECITHIN PO Take by mouth.   lisinopril-hydrochlorothiazide 20-25 MG tablet Commonly known as:  PRINZIDE,ZESTORETIC Take 1 tablet by mouth daily.   mometasone 0.1 % cream Commonly known as:  ELOCON Apply 1 application topically daily. Apply to ear rash when occurs   MULTIPLE VITAMINS-MINERALS PO Take by mouth.   tamsulosin 0.4 MG Caps capsule Commonly known as:  FLOMAX Take 1 capsule (0.4 mg total) by mouth daily.   TAZTIA XT 240 MG 24 hr capsule Generic drug:  diltiazem   Testosterone Enanthate 50 MG/0.5ML Soaj Commonly known as:  XYOSTED Inject 50 mg into the skin every 7 (seven) days.   VENTOLIN HFA 108 (90 Base) MCG/ACT inhaler Generic drug:  albuterol Inhale into the lungs.   Vitamin D 50 MCG (2000 UT) Caps Take by mouth.   vitamin E 400 UNIT capsule Take 400 Units by mouth daily.       Allergies:  Allergies  Allergen Reactions  . Percocet [Oxycodone-Acetaminophen]     Hives and itching    Family History: Family History  Problem Relation Age of Onset  . Myasthenia gravis Mother   . Heart disease Father   . Healthy Sister   . Prostate cancer Neg Hx   . Bladder Cancer Neg Hx   . Kidney cancer Neg Hx     Social History:  reports that he has quit smoking. His smoking use included cigarettes. He has a 27.00 pack-year smoking history. He has never used smokeless tobacco. He reports that he does not drink alcohol or use drugs.  ROS: UROLOGY Frequent Urination?: No Hard to postpone urination?: Yes Burning/pain with urination?: No Get up at night to urinate?: Yes Leakage of urine?: Yes Urine stream starts and stops?: No Trouble starting stream?: No Do you have to strain to urinate?: No Blood in urine?: No Urinary tract infection?: No Sexually transmitted disease?: No Injury to kidneys or bladder?:  No Painful intercourse?: No Weak stream?: No Erection problems?: No Penile pain?: No  Gastrointestinal Nausea?: No Vomiting?: No Indigestion/heartburn?: No Diarrhea?: No Constipation?: No  Constitutional Fever: No Night sweats?: No Weight loss?: No Fatigue?: No  Skin Skin rash/lesions?: No Itching?: No  Eyes Blurred vision?: No Double vision?: No  Ears/Nose/Throat Sore throat?: No Sinus problems?: No  Hematologic/Lymphatic Swollen glands?: No Easy bruising?: No  Cardiovascular Leg swelling?: No Chest pain?: No  Respiratory Cough?: No Shortness of breath?: No  Endocrine Excessive thirst?: No  Musculoskeletal Back pain?: No Joint pain?: No  Neurological Headaches?: No Dizziness?: No     Physical Exam: BP 130/75   Pulse 96   Ht 5\' 8"  (1.727 m)   Wt (!) 462 lb (209.6 kg)   BMI 70.25 kg/m   Constitutional:  Well nourished. Alert and oriented, No acute distress. Cardiovascular: No clubbing, cyanosis, or edema. Respiratory: Normal respiratory effort, no increased work of breathing. Skin: No rashes, bruises or suspicious lesions. Neurologic: Grossly intact, no focal deficits, moving all 4 extremities. Psychiatric: Normal mood and affect.  Laboratory Data: Lab Results  Component Value Date   WBC 9.5 02/12/2019   HGB 16.0 02/12/2019   HCT 47.3 02/12/2019   MCV 91 02/12/2019   PLT 279 11/21/2018    Lab Results  Component Value Date   CREATININE 0.90 11/21/2018    No results found for: PSA  Lab Results  Component Value Date   TESTOSTERONE 257 (L) 02/12/2019   Results for TEX, CONROY (MRN [id number]) as of 02/12/2019 14:35  Ref. Range  10/27/2017 10:02 11/03/2017 12:16 12/13/2017 15:58  Sex Horm Binding Glob, Serum Latest Ref Range: 22 - 77 nmol/L 25 27 24   Testosterone Latest Ref Range: 250 - 827 ng/dL 204 (L) 194 (L) 179 (L)   Lab Results  Component Value Date   HGBA1C 6.2 10/31/2018    Urinalysis    Component Value  Date/Time   APPEARANCEUR Clear 05/31/2018 1535   GLUCOSEU Negative 05/31/2018 1535   BILIRUBINUR Negative 05/31/2018 1535   PROTEINUR Negative 05/31/2018 1535   UROBILINOGEN 0.2 11/07/2017 1350   NITRITE Negative 05/31/2018 1535   LEUKOCYTESUR Negative 05/31/2018 1535    Lab Results  Component Value Date   LABMICR See below: 05/31/2018   WBCUA 0-5 05/31/2018   RBCUA 0-2 05/31/2018   LABEPIT 0-10 05/31/2018   MUCUS Present (A) 05/31/2018   BACTERIA Few (A) 05/31/2018   Assessment & Plan:    1. Testosterone deficiency - Discussed with patient his history and why he was seeking treatment - Discussed with patient the three types of injections, how each works, and what his preferences are; his preference is to try subcutaneous - Due to prior increased irritability and agression, patient is very agreeable to starting with a low dose - We will try Xyosted 50 mg SQ weekly - will recheck his testosterone level in 5 weeks with a symptoms recheck   Return in about 5 weeks (around 03/20/2019) for follow up and testosterone level .  Zara Council, PA-C  [facility] [address], [place], [place] [address] [phone]  I, Adele Schilder, am acting as a scribe for [facility], PA-C   I have reviewed the above documentation for accuracy and completeness, and I agree with the above.    Zara Council, PA-C

## 2019-02-13 ENCOUNTER — Encounter: Payer: Self-pay | Admitting: Urology

## 2019-02-13 ENCOUNTER — Ambulatory Visit: Payer: Medicare HMO | Admitting: Urology

## 2019-02-13 VITALS — BP 130/75 | HR 96 | Ht 68.0 in | Wt >= 6400 oz

## 2019-02-13 DIAGNOSIS — N529 Male erectile dysfunction, unspecified: Secondary | ICD-10-CM | POA: Diagnosis not present

## 2019-02-13 DIAGNOSIS — E349 Endocrine disorder, unspecified: Secondary | ICD-10-CM | POA: Diagnosis not present

## 2019-02-13 LAB — CBC WITH DIFFERENTIAL
Basophils Absolute: 0.1 10*3/uL (ref 0.0–0.2)
Basos: 1 %
EOS (ABSOLUTE): 0.4 10*3/uL (ref 0.0–0.4)
Eos: 4 %
Hematocrit: 47.3 % (ref 37.5–51.0)
Hemoglobin: 16 g/dL (ref 13.0–17.7)
Immature Grans (Abs): 0 10*3/uL (ref 0.0–0.1)
Immature Granulocytes: 0 %
LYMPHS ABS: 2.5 10*3/uL (ref 0.7–3.1)
Lymphs: 26 %
MCH: 30.7 pg (ref 26.6–33.0)
MCHC: 33.8 g/dL (ref 31.5–35.7)
MCV: 91 fL (ref 79–97)
MONOS ABS: 1.3 10*3/uL — AB (ref 0.1–0.9)
Monocytes: 14 %
NEUTROS PCT: 55 %
Neutrophils Absolute: 5.3 10*3/uL (ref 1.4–7.0)
RBC: 5.21 x10E6/uL (ref 4.14–5.80)
RDW: 13.3 % (ref 11.6–15.4)
WBC: 9.5 10*3/uL (ref 3.4–10.8)

## 2019-02-13 LAB — TESTOSTERONE: Testosterone: 257 ng/dL — ABNORMAL LOW (ref 264–916)

## 2019-02-13 MED ORDER — TESTOSTERONE ENANTHATE 50 MG/0.5ML ~~LOC~~ SOAJ: 50.0000 mg | SUBCUTANEOUS | 5 refills | Status: DC

## 2019-02-20 DIAGNOSIS — R0609 Other forms of dyspnea: Secondary | ICD-10-CM | POA: Diagnosis not present

## 2019-02-20 DIAGNOSIS — G4733 Obstructive sleep apnea (adult) (pediatric): Secondary | ICD-10-CM | POA: Diagnosis not present

## 2019-02-20 DIAGNOSIS — J449 Chronic obstructive pulmonary disease, unspecified: Secondary | ICD-10-CM | POA: Diagnosis not present

## 2019-02-21 DIAGNOSIS — R0789 Other chest pain: Secondary | ICD-10-CM | POA: Diagnosis not present

## 2019-02-21 DIAGNOSIS — N400 Enlarged prostate without lower urinary tract symptoms: Secondary | ICD-10-CM | POA: Diagnosis not present

## 2019-02-21 DIAGNOSIS — G4733 Obstructive sleep apnea (adult) (pediatric): Secondary | ICD-10-CM | POA: Diagnosis not present

## 2019-02-21 DIAGNOSIS — J449 Chronic obstructive pulmonary disease, unspecified: Secondary | ICD-10-CM | POA: Diagnosis not present

## 2019-02-21 DIAGNOSIS — E119 Type 2 diabetes mellitus without complications: Secondary | ICD-10-CM | POA: Diagnosis not present

## 2019-02-21 DIAGNOSIS — R69 Illness, unspecified: Secondary | ICD-10-CM | POA: Diagnosis not present

## 2019-02-21 DIAGNOSIS — E785 Hyperlipidemia, unspecified: Secondary | ICD-10-CM | POA: Diagnosis not present

## 2019-02-21 DIAGNOSIS — I10 Essential (primary) hypertension: Secondary | ICD-10-CM | POA: Diagnosis not present

## 2019-02-23 DIAGNOSIS — Z6841 Body Mass Index (BMI) 40.0 and over, adult: Secondary | ICD-10-CM | POA: Diagnosis not present

## 2019-02-23 DIAGNOSIS — I10 Essential (primary) hypertension: Secondary | ICD-10-CM | POA: Diagnosis not present

## 2019-02-23 DIAGNOSIS — E119 Type 2 diabetes mellitus without complications: Secondary | ICD-10-CM | POA: Diagnosis not present

## 2019-02-23 DIAGNOSIS — I499 Cardiac arrhythmia, unspecified: Secondary | ICD-10-CM | POA: Diagnosis not present

## 2019-02-23 DIAGNOSIS — J449 Chronic obstructive pulmonary disease, unspecified: Secondary | ICD-10-CM | POA: Diagnosis not present

## 2019-02-23 DIAGNOSIS — M109 Gout, unspecified: Secondary | ICD-10-CM | POA: Diagnosis not present

## 2019-02-23 DIAGNOSIS — E785 Hyperlipidemia, unspecified: Secondary | ICD-10-CM | POA: Diagnosis not present

## 2019-02-23 DIAGNOSIS — I951 Orthostatic hypotension: Secondary | ICD-10-CM | POA: Diagnosis not present

## 2019-02-23 DIAGNOSIS — N400 Enlarged prostate without lower urinary tract symptoms: Secondary | ICD-10-CM | POA: Diagnosis not present

## 2019-03-18 ENCOUNTER — Other Ambulatory Visit: Payer: Self-pay

## 2019-03-18 DIAGNOSIS — E349 Endocrine disorder, unspecified: Secondary | ICD-10-CM

## 2019-03-20 ENCOUNTER — Other Ambulatory Visit: Payer: Self-pay

## 2019-03-20 ENCOUNTER — Other Ambulatory Visit: Payer: Medicare HMO

## 2019-03-20 DIAGNOSIS — E349 Endocrine disorder, unspecified: Secondary | ICD-10-CM | POA: Diagnosis not present

## 2019-03-21 ENCOUNTER — Telehealth: Payer: Self-pay | Admitting: *Deleted

## 2019-03-21 ENCOUNTER — Telehealth (INDEPENDENT_AMBULATORY_CARE_PROVIDER_SITE_OTHER): Payer: Medicare HMO | Admitting: Urology

## 2019-03-21 ENCOUNTER — Telehealth: Payer: Self-pay | Admitting: Urology

## 2019-03-21 DIAGNOSIS — E349 Endocrine disorder, unspecified: Secondary | ICD-10-CM

## 2019-03-21 LAB — TESTOSTERONE: Testosterone: 352 ng/dL (ref 264–916)

## 2019-03-21 NOTE — Telephone Encounter (Signed)
Received fax from [facility] for approval of [employer] # [id number]  Dates: 12/17/18-12/19/2019

## 2019-03-21 NOTE — Telephone Encounter (Signed)
Would you schedule a morning lab for testosterone in one month for Jeremy Hendrix?  If we are still having the COVID-19 screening, please let [name] know not to go to the [facility].

## 2019-03-21 NOTE — Progress Notes (Signed)
Virtual Visit via Telephone Note  I connected with Jeremy Hendrix on 14/43/1540 at 1009 by telephone and verified that I am speaking with the correct person using two identifiers.  They are located at home.   I am located at my home.    This visit type was conducted due to national recommendations for restrictions regarding the COVID-19 Pandemic (e.g. social distancing).  This format is felt to be most appropriate for this patient at this time.  All issues noted in this document were discussed and addressed.  No physical exam was performed.   I discussed the limitations, risks, security and privacy concerns of performing an evaluation and management service by telephone and the availability of in person appointments. I also discussed with the patient that there may be a patient responsible charge related to this service. The patient expressed understanding and agreed to proceed.   History of Present Illness: Mr. Jeremy Hendrix is a 63 year old male with a history of low testosterone who was last seen in our office on 02/13/2019 and started on Xyosted 50 mg sq injections.  He has received the medication, but he has not yet started the injections.  He did come to the office yesterday for morning testosterone draw in anticipation that he had been administering the sq injections for 5 weeks at this time.  Surprisingly his morning testosterone came back at 352.  He did state that he was started on an appetite suppressing medication recently and has not been eating as much and also has been increasing his exercise.   Observations/Objective: Serum testosterone 352 ng/dL on 03/20/2019  Assessment and Plan:  1. Low Testosterone At this time we have decided to postpone starting the Xyosted injections at this time and see what his body will do naturally with a decrease in food intake and the increase in exercising for his testosterone level He will return in 1 month's time for a morning testosterone draw and  follow-up will be pending that result My hope is that if the patient continues to decrease food intake and increase his exercise his testosterone level will rise naturally and we can forego testosterone treatments  Follow Up Instructions:  Mr. Jeremy Hendrix will return to the office in 1 month for a morning serum testosterone draw before 10 AM   I discussed the assessment and treatment plan with the patient. The patient was provided an opportunity to ask questions and all were answered. The patient agreed with the plan and demonstrated an understanding of the instructions.   The patient was advised to call back or seek an in-person evaluation if the symptoms worsen or if the condition fails to improve as anticipated.  I provided 13 minutes of non-face-to-face time during this encounter.   Stoy Fenn, PA-C

## 2019-03-22 ENCOUNTER — Ambulatory Visit: Payer: Medicare HMO | Admitting: Urology

## 2019-04-16 ENCOUNTER — Other Ambulatory Visit: Payer: Self-pay | Admitting: Family Medicine

## 2019-04-22 ENCOUNTER — Other Ambulatory Visit: Payer: Self-pay | Admitting: Family Medicine

## 2019-04-22 DIAGNOSIS — B356 Tinea cruris: Secondary | ICD-10-CM

## 2019-04-22 NOTE — Telephone Encounter (Signed)
Pt needs a refill on his  Ketoconazole  2% cream  Jeremy Hendrix  [employer]

## 2019-04-23 ENCOUNTER — Other Ambulatory Visit: Payer: Self-pay

## 2019-04-23 ENCOUNTER — Other Ambulatory Visit: Payer: Medicare HMO

## 2019-04-23 DIAGNOSIS — E349 Endocrine disorder, unspecified: Secondary | ICD-10-CM

## 2019-04-23 MED ORDER — KETOCONAZOLE 2 % EX CREA: 1.0000 "application " | TOPICAL_CREAM | Freq: Every day | CUTANEOUS | 2 refills | Status: DC

## 2019-04-24 DIAGNOSIS — I10 Essential (primary) hypertension: Secondary | ICD-10-CM | POA: Diagnosis not present

## 2019-04-24 DIAGNOSIS — E119 Type 2 diabetes mellitus without complications: Secondary | ICD-10-CM | POA: Diagnosis not present

## 2019-04-24 DIAGNOSIS — R635 Abnormal weight gain: Secondary | ICD-10-CM | POA: Diagnosis not present

## 2019-04-24 LAB — TESTOSTERONE: Testosterone: 269 ng/dL (ref 264–916)

## 2019-05-09 ENCOUNTER — Telehealth: Payer: Self-pay | Admitting: Urology

## 2019-05-09 NOTE — Telephone Encounter (Signed)
Pt. Called and would like for someone to call him about his lab results. He said he usually gets a call within a few days and it has been a couple weeks.

## 2019-05-09 NOTE — Telephone Encounter (Signed)
I sent him a detailed message via MyChart regarding his lab results on 04/24/2019.  Please have him check his MyChart account.

## 2019-06-05 DIAGNOSIS — I10 Essential (primary) hypertension: Secondary | ICD-10-CM | POA: Diagnosis not present

## 2019-06-05 DIAGNOSIS — E785 Hyperlipidemia, unspecified: Secondary | ICD-10-CM | POA: Diagnosis not present

## 2019-06-05 DIAGNOSIS — E119 Type 2 diabetes mellitus without complications: Secondary | ICD-10-CM | POA: Diagnosis not present

## 2019-06-06 ENCOUNTER — Ambulatory Visit: Payer: Medicare HMO

## 2019-06-27 ENCOUNTER — Ambulatory Visit: Payer: Medicare HMO | Admitting: Urology

## 2019-06-27 DIAGNOSIS — G4733 Obstructive sleep apnea (adult) (pediatric): Secondary | ICD-10-CM | POA: Diagnosis not present

## 2019-06-27 DIAGNOSIS — E785 Hyperlipidemia, unspecified: Secondary | ICD-10-CM | POA: Diagnosis not present

## 2019-06-27 DIAGNOSIS — I10 Essential (primary) hypertension: Secondary | ICD-10-CM | POA: Diagnosis not present

## 2019-06-27 DIAGNOSIS — E119 Type 2 diabetes mellitus without complications: Secondary | ICD-10-CM | POA: Diagnosis not present

## 2019-06-27 DIAGNOSIS — N400 Enlarged prostate without lower urinary tract symptoms: Secondary | ICD-10-CM | POA: Diagnosis not present

## 2019-06-27 DIAGNOSIS — J449 Chronic obstructive pulmonary disease, unspecified: Secondary | ICD-10-CM | POA: Diagnosis not present

## 2019-06-27 DIAGNOSIS — R69 Illness, unspecified: Secondary | ICD-10-CM | POA: Diagnosis not present

## 2019-07-23 NOTE — Progress Notes (Signed)
02/13/2019 4:26 PM   Jeremy Hendrix [id number]  Referring provider: Birdie Sons, [place],   [id number]  Chief Complaint  Patient presents with  . Urinary Frequency    HPI: Jeremy Hendrix is a 63 y.o. male with DM, COPD, HTN, gout, BPH/LUTS, unhealthy weight, and testosterone deficiency who presents today for follow up.    He has modified a number of lifestyle factors, including healthy eating, tight diabetes management, and exercising, but has had difficulty losing weight.  He believes, however, that his metabolism from low testosterone is preventing more significant weight loss.  He has previously been on topical testosterone replacement (Androderm 10/2017, switched to Testim 12/2017) by his PCP, but he felt this made him very irritable and he had discontinued that therapy.  He is interested in injections due to the better ability to properly adjust the dose.  He says his PCP did not check his testosterone, and he ceased taking the topical testosterone within 3 weeks due to how extremely irritable it made him.  He has not started his testosterone therapy.    BPH WITH LUTS  (prostate and/or bladder) I PSS: 18/3 PVR: 1 mL      Previous IPSS score: 11/2  He states that he has episodes that last for approximately 2 weeks where he experiences urinary frequency every 15 minutes.  He states he has no nighttime symptoms.  He is having pedal edema at that time.   He has not had 1 of these episodes for the last month.  The episodes started in March.  He has not noted anything that makes the urinary frequency worse or better.  He is diabetes, but he has not checked his sugars during these episodes.  Denies any dysuria, hematuria or suprapubic pain.  Denies any recent fevers, chills, nausea or vomiting.  He does not have a family history of PCa.   IPSS    Row Name 07/24/19 1100         International Prostate Symptom Score   How often  have you had the sensation of not emptying your bladder?  Almost always     How often have you had to urinate less than every two hours?  About half the time     How often have you found you stopped and started again several times when you urinated?  Not at All     How often have you found it difficult to postpone urination?  Almost always     How often have you had a weak urinary stream?  About half the time     How often have you had to strain to start urination?  Not at All     How many times did you typically get up at night to urinate?  2 Times     Total IPSS Score  18       Quality of Life due to urinary symptoms   If you were to spend the rest of your life with your urinary condition just the way it is now how would you feel about that?  Mixed        Score:  1-7 Mild 8-19 Moderate 20-35 Severe  Testosterone deficiency Patient is experiencing a decrease in libido, a lack of energy, a decrease in strength, a loss in height, falling asleep after dinner and less strong erections.  This is indicated by his responses to the ADAM questionnaire.  He does not have sleep  apnea.  He is reporting obesity, moderate to severe chronic obstructive lung disease, and Type 2 diabetes mellitus.  His pretreatment testosterone level was 257 nmol/L.  He has modified a number of lifestyle factors, including healthy eating, tight diabetes management, and exercising, but has had difficulty losing weight.  He believes, however, that his metabolism from low testosterone is preventing more significant weight loss.  He has previously been on topical testosterone replacement (Androderm 10/2017, switched to Testim 12/2017) by his PCP, but he felt this made him very irritable and he had discontinued that therapy.  He is interested in injections due to the better ability to properly adjust the dose.  He says his PCP did not check his testosterone, and he ceased taking the topical testosterone within 3 weeks due to how  extremely irritable it made him.  He has not started his testosterone therapy.    PMH: Past Medical History:  Diagnosis Date  . Diabetes mellitus without complication ([place])    type 2  . Gout   . Hypertension   . Lateral epicondylitis of right elbow   . Pruritus   . Seborrheic dermatitis     Surgical History: Past Surgical History:  Procedure Laterality Date  . CATARACT EXTRACTION, BILATERAL    . COLONOSCOPY WITH PROPOFOL N/A 07/21/2015   Procedure: COLONOSCOPY WITH PROPOFOL;  Surgeon: Lucilla Lame, MD;  Location: [facility] ENDOSCOPY;  Service: Endoscopy;  Laterality: N/A;  . HIP SURGERY      Home Medications:  Allergies as of 07/24/2019      Reactions   Percocet [oxycodone-acetaminophen]    Hives and itching      Medication List       Accurate as of July 24, 2019  4:26 PM. If you have any questions, ask your nurse or doctor.        allopurinol 100 MG tablet Commonly known as: ZYLOPRIM Take 1 tablet by mouth daily.   aspirin 325 MG tablet Take 325 mg by mouth daily.   atorvastatin 20 MG tablet Commonly known as: LIPITOR Take 20 mg by mouth daily.   b complex vitamins capsule Take 1 capsule by mouth daily.   Fish Oil 1000 MG Caps Take by mouth.   fluticasone 50 MCG/ACT nasal spray Commonly known as: FLONASE Place 2 sprays into both nostrils daily.   Fluticasone-Salmeterol 250-50 MCG/DOSE Aepb Commonly known as: ADVAIR Inhale 1 puff into the lungs 2 (two) times daily.   glucose blood test strip Commonly known as: ONE TOUCH ULTRA TEST Use as instructed to check daily for type 2 diabetes e11.9   Incruse Ellipta 62.5 MCG/INH Aepb Generic drug: umeclidinium bromide   ketoconazole 2 % cream Commonly known as: NIZORAL Apply 1 application topically daily.   LECITHIN PO Take by mouth.   lisinopril-hydrochlorothiazide 20-25 MG tablet Commonly known as: ZESTORETIC Take 1 tablet by mouth daily. What changed: how much to take   mometasone 0.1 % cream  Commonly known as: ELOCON Apply 1 application topically daily. Apply to ear rash when occurs   MULTIPLE VITAMINS-MINERALS PO Take by mouth.   phentermine 37.5 MG tablet Commonly known as: ADIPEX-P TAKE 1 2 (ONE HALF) TABLET BY MOUTH DAILY FOR 3 DAYS THEN TAKE 1 TABLET DAILY BEFORE LUNCH.   tamsulosin 0.4 MG Caps capsule Commonly known as: FLOMAX Take 1 capsule (0.4 mg total) by mouth daily.   Taztia XT 240 MG 24 hr capsule Generic drug: diltiazem   Testosterone Enanthate 50 MG/0.5ML Soaj Commonly known as: Xyosted Inject 50  mg into the skin every 7 (seven) days.   Ventolin HFA 108 (90 Base) MCG/ACT inhaler Generic drug: albuterol Inhale into the lungs.   Vitamin D 50 MCG (2000 UT) Caps Take by mouth.   vitamin E 400 UNIT capsule Take 400 Units by mouth daily.       Allergies:  Allergies  Allergen Reactions  . Percocet [Oxycodone-Acetaminophen]     Hives and itching    Family History: Family History  Problem Relation Age of Onset  . Myasthenia gravis Mother   . Heart disease Father   . Healthy Sister   . Prostate cancer Neg Hx   . Bladder Cancer Neg Hx   . Kidney cancer Neg Hx     Social History:  reports that he has quit smoking. His smoking use included cigarettes. He has a 27.00 pack-year smoking history. He has never used smokeless tobacco. He reports that he does not drink alcohol or use drugs.  ROS: UROLOGY Frequent Urination?: Yes Hard to postpone urination?: Yes Burning/pain with urination?: No Get up at night to urinate?: No Leakage of urine?: Yes Urine stream starts and stops?: No Trouble starting stream?: No Do you have to strain to urinate?: No Blood in urine?: No Urinary tract infection?: No Sexually transmitted disease?: No Injury to kidneys or bladder?: No Painful intercourse?: No Weak stream?: No Erection problems?: Yes Penile pain?: No  Gastrointestinal Nausea?: No Vomiting?: No Indigestion/heartburn?: No Diarrhea?: No  Constipation?: No  Constitutional Fever: No Night sweats?: No Weight loss?: No Fatigue?: No  Skin Skin rash/lesions?: No Itching?: No  Eyes Blurred vision?: No Double vision?: No  Ears/Nose/Throat Sore throat?: No Sinus problems?: No  Hematologic/Lymphatic Swollen glands?: No Easy bruising?: No  Cardiovascular Leg swelling?: No Chest pain?: No  Respiratory Cough?: Yes Shortness of breath?: Yes  Endocrine Excessive thirst?: No  Musculoskeletal Back pain?: No Joint pain?: No  Neurological Headaches?: No Dizziness?: No  Psychologic Depression?: No Anxiety?: No  Physical Exam: BP (!) 136/94 (BP Location: Left Arm, Patient Position: Sitting)   Pulse (!) 112   Ht 5\' 8"  (1.727 m)   Wt (!) 380 lb (172.4 kg) Comment: per patient  BMI 57.78 kg/m   Constitutional:  Well nourished. Alert and oriented, No acute distress. HEENT: [place] AT, moist mucus membranes.  Trachea midline, no masses. Cardiovascular: No clubbing, cyanosis, or edema. Respiratory: Normal respiratory effort, no increased work of breathing. Neurologic: Grossly intact, no focal deficits, moving all 4 extremities. Psychiatric: Normal mood and affect.  Laboratory Data: Lab Results  Component Value Date   WBC 9.5 02/12/2019   HGB 16.0 02/12/2019   HCT 47.3 02/12/2019   MCV 91 02/12/2019   PLT 279 11/21/2018    Lab Results  Component Value Date   CREATININE 0.90 11/21/2018    No results found for: PSA  Lab Results  Component Value Date   TESTOSTERONE 269 04/23/2019   Results for Jeremy, Hendrix (MRN [id number]) as of 02/12/2019 14:35  Ref. Range 10/27/2017 10:02 11/03/2017 12:16 12/13/2017 15:58  Sex Horm Binding Glob, Serum Latest Ref Range: 22 - 77 nmol/L 25 27 24   Testosterone Latest Ref Range: 250 - 827 ng/dL 204 (L) 194 (L) 179 (L)   Lab Results  Component Value Date   HGBA1C 6.2 10/31/2018    Urinalysis Negative.  See Epic.  I have reviewed the labs.  Assessment &  Plan:    1. Frequency As patient is not having symptoms at this time and has not had the  symptoms for several weeks We discussed checking his blood sugars the next time these episodes occur and I have given him Myrbetriq 25 mg samples to take if the episodes occur again He is worried about something more ominous is going on, but his UA is negative and he is not experiencing gross hematuria or dysuria during these events, offered RUS and cysto to ease his mind but he declined   2. Testosterone deficiency Hold therapy at this time as patient is experiencing pedal edema   Return if symptoms worsen or fail to improve.  Zara Council, PA-C  [facility] [address] [place], [place] [address] [phone]

## 2019-07-24 ENCOUNTER — Ambulatory Visit (INDEPENDENT_AMBULATORY_CARE_PROVIDER_SITE_OTHER): Payer: Medicare HMO | Admitting: Urology

## 2019-07-24 ENCOUNTER — Other Ambulatory Visit: Payer: Self-pay

## 2019-07-24 ENCOUNTER — Encounter: Payer: Self-pay | Admitting: Urology

## 2019-07-24 VITALS — BP 136/94 | HR 112 | Ht 68.0 in | Wt 380.0 lb

## 2019-07-24 DIAGNOSIS — E349 Endocrine disorder, unspecified: Secondary | ICD-10-CM | POA: Diagnosis not present

## 2019-07-24 DIAGNOSIS — R350 Frequency of micturition: Secondary | ICD-10-CM

## 2019-07-24 LAB — URINALYSIS, COMPLETE
Bilirubin, UA: NEGATIVE
Glucose, UA: NEGATIVE
Ketones, UA: NEGATIVE
Leukocytes,UA: NEGATIVE
Nitrite, UA: NEGATIVE
Protein,UA: NEGATIVE
RBC, UA: NEGATIVE
Specific Gravity, UA: 1.025 (ref 1.005–1.030)
Urobilinogen, Ur: 0.2 mg/dL (ref 0.2–1.0)
pH, UA: 5.5 (ref 5.0–7.5)

## 2019-07-24 LAB — MICROSCOPIC EXAMINATION
Bacteria, UA: NONE SEEN
RBC, Urine: NONE SEEN /hpf (ref 0–2)

## 2019-07-24 LAB — BLADDER SCAN AMB NON-IMAGING

## 2019-07-26 LAB — CULTURE, URINE COMPREHENSIVE

## 2019-08-20 ENCOUNTER — Ambulatory Visit: Payer: Self-pay | Admitting: Family Medicine

## 2019-08-28 DIAGNOSIS — R635 Abnormal weight gain: Secondary | ICD-10-CM | POA: Diagnosis not present

## 2019-08-28 DIAGNOSIS — E119 Type 2 diabetes mellitus without complications: Secondary | ICD-10-CM | POA: Diagnosis not present

## 2019-08-28 DIAGNOSIS — I10 Essential (primary) hypertension: Secondary | ICD-10-CM | POA: Diagnosis not present

## 2019-08-30 ENCOUNTER — Ambulatory Visit (INDEPENDENT_AMBULATORY_CARE_PROVIDER_SITE_OTHER): Payer: Medicare HMO | Admitting: Family Medicine

## 2019-08-30 ENCOUNTER — Encounter: Payer: Self-pay | Admitting: Family Medicine

## 2019-08-30 ENCOUNTER — Other Ambulatory Visit: Payer: Self-pay

## 2019-08-30 VITALS — BP 110/68 | HR 107 | Temp 97.3°F | Resp 18 | Wt 374.8 lb

## 2019-08-30 DIAGNOSIS — B354 Tinea corporis: Secondary | ICD-10-CM | POA: Diagnosis not present

## 2019-08-30 DIAGNOSIS — Z23 Encounter for immunization: Secondary | ICD-10-CM

## 2019-08-30 MED ORDER — KETOCONAZOLE 2 % EX CREA: 1.0000 "application " | TOPICAL_CREAM | Freq: Two times a day (BID) | CUTANEOUS | 0 refills | Status: AC

## 2019-08-30 NOTE — Patient Instructions (Signed)
.   Please review the attached list of medications and notify my office if there are any errors.   . Please bring all of your medications to every appointment so we can make sure that our medication list is the same as yours.   . It is especially important to get the annual flu vaccine this year. If you haven't had it already, please go to your pharmacy or call the office as soon as possible to schedule you flu shot.  

## 2019-08-30 NOTE — Progress Notes (Signed)
Patient: Jeremy Hendrix Male    DOB: [id number]   63 y.o.   MRN: [id number] Visit Date: 08/30/2019  Today's Provider: Lelon Huh, MD   Chief Complaint  Patient presents with  . Rash   Subjective:     Patient report sore and itchy rash off and on for the last couple of months right lateral distal leg. Clears up after applying alcohol for a few days, but always flares back up. No known injury. Not applied anything besides alcohol to area. Does not seem to be spreading.   Allergies  Allergen Reactions  . Percocet [Oxycodone-Acetaminophen]     Hives and itching     Current Outpatient Medications:  .  albuterol (VENTOLIN HFA) 108 (90 Base) MCG/ACT inhaler, Inhale into the lungs., Disp: , Rfl:  .  allopurinol (ZYLOPRIM) 100 MG tablet, Take 1 tablet by mouth daily., Disp: 90 tablet, Rfl: 3 .  aspirin 325 MG tablet, Take 325 mg by mouth daily., Disp: , Rfl:  .  atorvastatin (LIPITOR) 20 MG tablet, Take 20 mg by mouth daily., Disp: , Rfl:  .  b complex vitamins capsule, Take 1 capsule by mouth daily., Disp: , Rfl:  .  Cholecalciferol (VITAMIN D) 2000 UNITS CAPS, Take by mouth., Disp: , Rfl:  .  fluticasone (FLONASE) 50 MCG/ACT nasal spray, Place 2 sprays into both nostrils daily., Disp: 16 g, Rfl: 0 .  Fluticasone-Salmeterol (ADVAIR) 250-50 MCG/DOSE AEPB, Inhale 1 puff into the lungs 2 (two) times daily., Disp: 60 each, Rfl: 4 .  glucose blood (ONE TOUCH ULTRA TEST) test strip, Use as instructed to check daily for type 2 diabetes e11.9, Disp: 100 each, Rfl: 3 .  INCRUSE ELLIPTA 62.5 MCG/INH AEPB, , Disp: , Rfl:  .  ketoconazole (NIZORAL) 2 % cream, Apply 1 application topically daily., Disp: 90 g, Rfl: 2 .  LECITHIN PO, Take by mouth., Disp: , Rfl:  .  lisinopril-hydrochlorothiazide (PRINZIDE,ZESTORETIC) 20-25 MG tablet, Take 1 tablet by mouth daily. (Patient taking differently: Take 2 tablets by mouth daily. ), Disp: 90 tablet, Rfl: 4 .  mometasone (ELOCON) 0.1 % cream,  Apply 1 application topically daily. Apply to ear rash when occurs, Disp: 30 g, Rfl: 0 .  MULTIPLE VITAMINS-MINERALS PO, Take by mouth., Disp: , Rfl:  .  Omega-3 Fatty Acids (FISH OIL) 1000 MG CAPS, Take by mouth., Disp: , Rfl:  .  phentermine (ADIPEX-P) 37.5 MG tablet, TAKE 1 2 (ONE HALF) TABLET BY MOUTH DAILY FOR 3 DAYS THEN TAKE 1 TABLET DAILY BEFORE LUNCH., Disp: , Rfl:  .  tamsulosin (FLOMAX) 0.4 MG CAPS capsule, Take 1 capsule (0.4 mg total) by mouth daily., Disp: 30 capsule, Rfl: 11 .  TAZTIA XT 240 MG 24 hr capsule, , Disp: , Rfl:  .  Testosterone Enanthate (XYOSTED) 50 MG/0.5ML SOAJ, Inject 50 mg into the skin every 7 (seven) days., Disp: 4 pen, Rfl: 5 .  vitamin E 400 UNIT capsule, Take 400 Units by mouth daily., Disp: , Rfl:   Review of Systems  Constitutional: Negative for appetite change, chills and fever.  Respiratory: Negative for chest tightness, shortness of breath and wheezing.   Cardiovascular: Negative for chest pain and palpitations.  Gastrointestinal: Negative for abdominal pain, nausea and vomiting.    Social History   Tobacco Use  . Smoking status: Former Smoker    Packs/day: 1.50    Years: 18.00    Pack years: 27.00    Types: Cigarettes  . Smokeless  tobacco: Never Used  . Tobacco comment: QUIT IN 2003  Substance Use Topics  . Alcohol use: No    Alcohol/week: 0.0 standard drinks      Objective:   BP 110/68   Pulse (!) 107   Temp (!) 97.3 F (36.3 C) (Oral)   Resp 18   Wt (!) 374 lb 12.8 oz (170 kg)   SpO2 99%   BMI 56.99 kg/m     Physical Exam   Small irritated area right distal lateral leg circumscribed by slightly raised irregularly shaped line of erythema about 4cm in diameter consistent with tinea    Assessment & Plan    1. Tinea corporis  - ketoconazole (NIZORAL) 2 % cream; Apply 1 application topically 2 (two) times daily for 23 days.  Dispense: 45 g; Refill: 0 Call if symptoms change or if not rapidly improving.    2. Need for  influenza vaccination  - Flu Vaccine QUAD 36+ mos IM  The entirety of the information documented in the History of Present Illness, Review of Systems and Physical Exam were personally obtained by me. Portions of this information were initially documented by Minette Headland, CMA and reviewed by me for thoroughness and accuracy.      Lelon Huh, MD  [place]

## 2019-09-23 DIAGNOSIS — E113393 Type 2 diabetes mellitus with moderate nonproliferative diabetic retinopathy without macular edema, bilateral: Secondary | ICD-10-CM | POA: Diagnosis not present

## 2019-10-10 ENCOUNTER — Ambulatory Visit (INDEPENDENT_AMBULATORY_CARE_PROVIDER_SITE_OTHER): Payer: Medicare HMO | Admitting: Plastic Surgery

## 2019-10-10 ENCOUNTER — Other Ambulatory Visit: Payer: Self-pay

## 2019-10-10 VITALS — BP 139/121 | HR 100 | Temp 97.5°F | Ht 67.0 in | Wt 373.0 lb

## 2019-10-10 DIAGNOSIS — S6991XS Unspecified injury of right wrist, hand and finger(s), sequela: Secondary | ICD-10-CM | POA: Diagnosis not present

## 2019-10-10 DIAGNOSIS — J449 Chronic obstructive pulmonary disease, unspecified: Secondary | ICD-10-CM | POA: Diagnosis not present

## 2019-10-10 DIAGNOSIS — E785 Hyperlipidemia, unspecified: Secondary | ICD-10-CM | POA: Diagnosis not present

## 2019-10-10 DIAGNOSIS — G4733 Obstructive sleep apnea (adult) (pediatric): Secondary | ICD-10-CM | POA: Diagnosis not present

## 2019-10-10 DIAGNOSIS — I10 Essential (primary) hypertension: Secondary | ICD-10-CM | POA: Diagnosis not present

## 2019-10-10 DIAGNOSIS — R69 Illness, unspecified: Secondary | ICD-10-CM | POA: Diagnosis not present

## 2019-10-10 DIAGNOSIS — E119 Type 2 diabetes mellitus without complications: Secondary | ICD-10-CM | POA: Diagnosis not present

## 2019-10-10 DIAGNOSIS — N400 Enlarged prostate without lower urinary tract symptoms: Secondary | ICD-10-CM | POA: Diagnosis not present

## 2019-10-10 NOTE — Progress Notes (Signed)
Referring Provider Birdie Sons, [address] [place],  [place] [address]   CC: No chief complaint on file.     Jeremy Hendrix is an 63 y.o. male.  HPI: He presents with complaints regarding his right thumb.  Over a year ago he had a saw injury to the tip of his right thumb.  At that time it was repaired and he had a small tuft fracture of the distal phalanx.  This went on to heal and his complaints now are sensitivity at the thumb when performing his work.  He says when he grabs a tool the area can be very sensitive.  Its not as sharp pain but a deeper pain right at the area who is his scar.  Allergies  Allergen Reactions  . Percocet [Oxycodone-Acetaminophen]     Hives and itching    Outpatient Encounter Medications as of 10/10/2019  Medication Sig Note  . albuterol (VENTOLIN HFA) 108 (90 Base) MCG/ACT inhaler Inhale into the lungs. 08/30/2016: Received from: [facility]: Inhale 2 inhalations into the lungs every 6 (six) hours as needed for Wheezing.  Marland Kitchen allopurinol (ZYLOPRIM) 100 MG tablet Take 1 tablet by mouth daily.   Marland Kitchen aspirin 325 MG tablet Take 325 mg by mouth daily.   Marland Kitchen atorvastatin (LIPITOR) 20 MG tablet Take 20 mg by mouth daily.   Marland Kitchen b complex vitamins capsule Take 1 capsule by mouth daily.   . Cholecalciferol (VITAMIN D) 2000 UNITS CAPS Take by mouth. 09/10/2015: Received from: [facility]  . fluticasone (FLONASE) 50 MCG/ACT nasal spray Place 2 sprays into both nostrils daily. (Patient not taking: Reported on 08/30/2019)   . Fluticasone-Salmeterol (ADVAIR) 250-50 MCG/DOSE AEPB Inhale 1 puff into the lungs 2 (two) times daily.   Marland Kitchen glucose blood (ONE TOUCH ULTRA TEST) test strip Use as instructed to check daily for type 2 diabetes e11.9   . INCRUSE ELLIPTA 62.5 MCG/INH AEPB    . ketoconazole (NIZORAL) 2 % cream Apply 1 application topically daily. (Patient not taking: Reported on 08/30/2019)   . LECITHIN PO  Take by mouth. 09/10/2015: Received from: [facility]  . lisinopril-hydrochlorothiazide (ZESTORETIC) 20-12.5 MG tablet    . mometasone (ELOCON) 0.1 % cream Apply 1 application topically daily. Apply to ear rash when occurs (Patient not taking: Reported on 08/30/2019)   . MULTIPLE VITAMINS-MINERALS PO Take by mouth. 09/10/2015: Received from: [facility]  . Omega-3 Fatty Acids (FISH OIL) 1000 MG CAPS Take by mouth.   . phentermine (ADIPEX-P) 37.5 MG tablet TAKE 1 2 (ONE HALF) TABLET BY MOUTH DAILY FOR 3 DAYS THEN TAKE 1 TABLET DAILY BEFORE LUNCH.   . tamsulosin (FLOMAX) 0.4 MG CAPS capsule Take 1 capsule (0.4 mg total) by mouth daily.   Marland Kitchen TAZTIA XT 240 MG 24 hr capsule  09/10/2015: Received from: External Pharmacy  . Testosterone Enanthate (XYOSTED) 50 MG/0.5ML SOAJ Inject 50 mg into the skin every 7 (seven) days. (Patient not taking: Reported on 08/30/2019)   . vitamin E 400 UNIT capsule Take 400 Units by mouth daily.    No facility-administered encounter medications on file as of 10/10/2019.      Past Medical History:  Diagnosis Date  . Diabetes mellitus without complication ([place])    type 2  . Gout   . Hypertension   . Lateral epicondylitis of right elbow   . Pruritus   . Seborrheic dermatitis     Past Surgical History:  Procedure Laterality Date  . CATARACT EXTRACTION, BILATERAL    . COLONOSCOPY WITH PROPOFOL N/A 07/21/2015   Procedure: COLONOSCOPY WITH PROPOFOL;  Surgeon: Lucilla Lame, MD;  Location: [facility] ENDOSCOPY;  Service: Endoscopy;  Laterality: N/A;  . HIP SURGERY      Family History  Problem Relation Age of Onset  . Myasthenia gravis Mother   . Heart disease Father   . Healthy Sister   . Prostate cancer Neg Hx   . Bladder Cancer Neg Hx   . Kidney cancer Neg Hx     Social History   Social History Narrative  . Not on file     Review of Systems General: Denies fevers, chills, weight loss CV: Denies chest pain, shortness of breath,  palpitations  Physical Exam Vitals with BMI 10/10/2019 08/30/2019 07/24/2019  Height 5\' 7"  - 5\' 8"   Weight 373 lbs 374 lbs 13 oz 380 lbs  BMI [id number] - [id number]  Systolic [id number]  Diastolic [id number] 68 94  Pulse 100 107 112    General:  No acute distress,  Alert and oriented, Non-Toxic, Normal speech and affect Focused exam of the right hand: Fingers are well-perfused with normal capillary refill and palpable radial pulse.  Sensation is intact throughout.  He has full range of motion.  At the tip of his thumb there is an oblique scar extending from the nailbed proximally across the pulp.  He has normal sensation on both sides of the scar.  The area is hypersensitive.  There is no focal spot that appears to elicit the tenderness.  There is no Tinel's or zinging sensations when I tap on the area.  He seems to have reasonable support of the nailbed.  Imaging was reviewed from his accident.  The distal half of the distal phalanx was extensively comminuted.  There did not appear to be any joint involvement.  Assessment/Plan Patient is presenting about a year out from a right thumb saw injury.  He has a resulting depressed scar and hypersensitivity in that area.  There is relatively less soft tissue bulk in the area of the scar to pad his thumb.  He has retained sensation in the thumb and I do not find any obvious evidence of a neuroma.  I offered him a full-thickness skin graft to be placed beneath the scarred area to give him more soft tissue support.  I explained I would elevate his pre-existing skin excise the scar in place de-epithelialized graft beneath the skin.  I explained that this would likely support the soft tissues and hopefully decrease the hypersensitivity in that area.  I explained the risk that include bleeding infection, scarring, need for additional procedures, and persistent pain.  I explained I would take the skin from his upper arm on the right side.  I answered all his questions we will  plan to move forward with this in the office.  Cindra Presume 10/10/2019, 2:34 PM

## 2019-10-21 ENCOUNTER — Encounter: Payer: Self-pay | Admitting: Family Medicine

## 2019-10-21 ENCOUNTER — Telehealth: Payer: Self-pay

## 2019-10-21 DIAGNOSIS — B356 Tinea cruris: Secondary | ICD-10-CM

## 2019-10-21 MED ORDER — CICLOPIROX OLAMINE 0.77 % EX CREA: TOPICAL_CREAM | Freq: Two times a day (BID) | CUTANEOUS | 1 refills | Status: DC

## 2019-10-21 NOTE — Telephone Encounter (Signed)
Pt stated that when he has his last OV on 08/30/2019 he was given ketoconazole (NIZORAL) 2 % cream for ring worm. Pt stated that it completely cleared up but has returned in the same spot (right leg above ankle). Pt is asking if he needs another OV or if Dr. Caryn Section can send in another medication to [facility]. Please advise. Thanks TNP

## 2019-11-01 DIAGNOSIS — I10 Essential (primary) hypertension: Secondary | ICD-10-CM | POA: Diagnosis not present

## 2019-11-01 DIAGNOSIS — E119 Type 2 diabetes mellitus without complications: Secondary | ICD-10-CM | POA: Diagnosis not present

## 2019-11-01 DIAGNOSIS — R635 Abnormal weight gain: Secondary | ICD-10-CM | POA: Diagnosis not present

## 2019-11-07 ENCOUNTER — Other Ambulatory Visit: Payer: Self-pay | Admitting: Urology

## 2019-11-07 ENCOUNTER — Other Ambulatory Visit: Payer: Self-pay

## 2019-11-07 ENCOUNTER — Ambulatory Visit: Payer: Medicare HMO | Admitting: Plastic Surgery

## 2019-11-07 VITALS — BP 159/92 | HR 63 | Temp 98.9°F

## 2019-11-07 DIAGNOSIS — S6991XS Unspecified injury of right wrist, hand and finger(s), sequela: Secondary | ICD-10-CM

## 2019-11-07 MED ORDER — HYDROCODONE-ACETAMINOPHEN 5-325 MG PO TABS: 1.0000 | ORAL_TABLET | Freq: Four times a day (QID) | ORAL | 0 refills | Status: AC | PRN

## 2019-11-07 NOTE — Progress Notes (Signed)
Operative Note   DATE OF OPERATION: 11/07/2019  SURGICAL DEPARTMENT: Plastic Surgery  PREOPERATIVE DIAGNOSES: Soft tissue deficit right thumb  POSTOPERATIVE DIAGNOSES:  same  PROCEDURE: 1.  Surgical preparation for skin graft right thumb totaling 1.5 x 2 cm 2.  Full-thickness skin graft to right thumb totaling 1.5 x 2 cm  SURGEON: Talmadge Coventry, MD  ASSISTANT: None  ANESTHESIA:  General.   COMPLICATIONS: None.   INDICATIONS FOR PROCEDURE:  The patient, Jeremy Hendrix is a 63 y.o. male born on June 19, 1956, is here for treatment of painful soft tissue deficit of his right thumb tip.  He had a saw injury that has resulted in a depressed scar that is chronically painful.  He has significant pain when grabbing his tools.  We discussed placing a de-epithelialized pull full-thickness skin graft beneath the skin along the scar in order to add some padding in that area between the skin and the bone. MRN: [id number]  CONSENT:  Informed consent was obtained directly from the patient. Risks, benefits and alternatives were fully discussed. Specific risks including but not limited to bleeding, infection, hematoma, seroma, scarring, pain, contracture, asymmetry, wound healing problems, and need for further surgery were all discussed. The patient did have an ample opportunity to have questions answered to satisfaction.   DESCRIPTION OF PROCEDURE:  Local anesthesia was administered.  The patient's operative site was prepped and draped in a sterile fashion. A time out was performed and all information was confirmed to be correct.  I started by incising along the scar in his thumb pulp.  I then made some relaxing incisions along his transition to the nailbed.  The skin flaps were then elevated in either direction creating a subcutaneous pocket.  The area of skin on the volar forearm was then de-epithelialized and a full-thickness skin graft was then harvested.  This was then inset into the soft  tissue defect in the thumb using Monocryl sutures.  The majority of the skin was able to be covered by his existing thumb skin to hold it in place.  The donor site was closed interrupted buried Monocryl sutures in a running 4-0 Monocryl.  Skin from an ice on table resulting corrected the soft tissue depression and deficiency in his thumb.  Thumb was placed in a splint and the form was covered with a soft dressing.  The patient tolerated the procedure well.  There were no complications.

## 2019-11-18 ENCOUNTER — Encounter: Payer: Self-pay | Admitting: Nurse Practitioner

## 2019-11-18 ENCOUNTER — Ambulatory Visit (INDEPENDENT_AMBULATORY_CARE_PROVIDER_SITE_OTHER): Payer: Medicare HMO | Admitting: Nurse Practitioner

## 2019-11-18 ENCOUNTER — Other Ambulatory Visit: Payer: Self-pay

## 2019-11-18 VITALS — BP 111/77 | HR 90 | Temp 98.8°F

## 2019-11-18 DIAGNOSIS — S6991XS Unspecified injury of right wrist, hand and finger(s), sequela: Secondary | ICD-10-CM

## 2019-11-18 NOTE — Progress Notes (Cosign Needed)
Patient ID: Jeremy Hendrix, male    DOB: [id number], 63 y.o.   MRN: [id number]   C.C.: follow up thumb wound  Jeremy Hendrix is a 63 yo male who presents for follow up after full thickness skin graft to right thumb on 11/07/19. Graft harvest site taken from right forearm. He is doing well today. He has been applying vaseline and covering the wound with gauze. Denies any pain.    Review of Systems  Constitutional: Negative.   HENT: Negative.   Respiratory: Negative.   Cardiovascular: Negative.   Gastrointestinal: Negative.   Genitourinary: Negative.   Musculoskeletal: Negative.   Skin: Positive for wound.  Neurological: Negative.     Past Medical History:  Diagnosis Date  . Diabetes mellitus without complication ([place])    type 2  . Gout   . Hypertension   . Lateral epicondylitis of right elbow   . Pruritus   . Seborrheic dermatitis     Past Surgical History:  Procedure Laterality Date  . CATARACT EXTRACTION, BILATERAL    . COLONOSCOPY WITH PROPOFOL N/A 07/21/2015   Procedure: COLONOSCOPY WITH PROPOFOL;  Surgeon: Lucilla Lame, MD;  Location: [facility] ENDOSCOPY;  Service: Endoscopy;  Laterality: N/A;  . HIP SURGERY        Current Outpatient Medications:  .  albuterol (VENTOLIN HFA) 108 (90 Base) MCG/ACT inhaler, Inhale into the lungs., Disp: , Rfl:  .  allopurinol (ZYLOPRIM) 100 MG tablet, Take 1 tablet by mouth daily., Disp: 90 tablet, Rfl: 3 .  aspirin 325 MG tablet, Take 325 mg by mouth daily., Disp: , Rfl:  .  atorvastatin (LIPITOR) 20 MG tablet, Take 20 mg by mouth daily., Disp: , Rfl:  .  b complex vitamins capsule, Take 1 capsule by mouth daily., Disp: , Rfl:  .  Cholecalciferol (VITAMIN D) 2000 UNITS CAPS, Take by mouth., Disp: , Rfl:  .  ciclopirox (LOPROX) 0.77 % cream, Apply topically 2 (two) times daily., Disp: 30 g, Rfl: 1 .  fluticasone (FLONASE) 50 MCG/ACT nasal spray, Place 2 sprays into both nostrils daily. (Patient not taking: Reported on 08/30/2019),  Disp: 16 g, Rfl: 0 .  Fluticasone-Salmeterol (ADVAIR) 250-50 MCG/DOSE AEPB, Inhale 1 puff into the lungs 2 (two) times daily., Disp: 60 each, Rfl: 4 .  glucose blood (ONE TOUCH ULTRA TEST) test strip, Use as instructed to check daily for type 2 diabetes e11.9, Disp: 100 each, Rfl: 3 .  HYDROcodone-acetaminophen (NORCO) 5-325 MG tablet, Take 1 tablet by mouth every 6 (six) hours as needed for moderate pain., Disp: 30 tablet, Rfl: 0 .  INCRUSE ELLIPTA 62.5 MCG/INH AEPB, , Disp: , Rfl:  .  ketoconazole (NIZORAL) 2 % cream, Apply 1 application topically daily. (Patient not taking: Reported on 08/30/2019), Disp: 90 g, Rfl: 2 .  LECITHIN PO, Take by mouth., Disp: , Rfl:  .  lisinopril-hydrochlorothiazide (ZESTORETIC) 20-12.5 MG tablet, , Disp: , Rfl:  .  mometasone (ELOCON) 0.1 % cream, Apply 1 application topically daily. Apply to ear rash when occurs (Patient not taking: Reported on 08/30/2019), Disp: 30 g, Rfl: 0 .  MULTIPLE VITAMINS-MINERALS PO, Take by mouth., Disp: , Rfl:  .  Omega-3 Fatty Acids (FISH OIL) 1000 MG CAPS, Take by mouth., Disp: , Rfl:  .  phentermine (ADIPEX-P) 37.5 MG tablet, TAKE 1 2 (ONE HALF) TABLET BY MOUTH DAILY FOR 3 DAYS THEN TAKE 1 TABLET DAILY BEFORE LUNCH., Disp: , Rfl:  .  tamsulosin (FLOMAX) 0.4 MG CAPS capsule, Take 1  capsule by mouth daily., Disp: 30 capsule, Rfl: 7 .  TAZTIA XT 240 MG 24 hr capsule, , Disp: , Rfl:  .  Testosterone Enanthate (XYOSTED) 50 MG/0.5ML SOAJ, Inject 50 mg into the skin every 7 (seven) days. (Patient not taking: Reported on 08/30/2019), Disp: 4 pen, Rfl: 5 .  vitamin E 400 UNIT capsule, Take 400 Units by mouth daily., Disp: , Rfl:    Objective:   Vitals:   11/18/19 1116  BP: 111/77  Pulse: 90  Temp: 98.8 F (37.1 C)  SpO2: 96%    Physical Exam  General: alert, calm, no acute distress HEENT:normocephalic Neck: full ROM Chest: symmetrical rise and fall Lungs: unlabored breathing Musculoskeletal: MAEx4 Neuro: A&O x3, calm,  cooperative, steady gait Extremities: right thumb with small amount dried blood, sutures intact  Skin: fight forearm incision clean, dry, intact, approximated, mild erythema, sutures intact   Assessment & Plan:  Thumb injury, right, sequela  Jeremy Hendrix is a 63 yo male POD #11 s/p full thickness skin graft to the right thumb. The wound is healing well. There appears to be improvement in the shape of the thumb. Several sutures were removed from the thumb and forearm harvest site. Continue current wound care of vaseline and gauze. He may begin using thumb and adding pressure as tolerated. Patient also assessed by Dr. Claudia Desanctis. Return in 3 weeks.     Alfredo Batty, NP

## 2019-11-19 DIAGNOSIS — J449 Chronic obstructive pulmonary disease, unspecified: Secondary | ICD-10-CM | POA: Diagnosis not present

## 2019-11-19 DIAGNOSIS — G4733 Obstructive sleep apnea (adult) (pediatric): Secondary | ICD-10-CM | POA: Diagnosis not present

## 2019-12-11 ENCOUNTER — Ambulatory Visit: Payer: Medicare HMO | Admitting: Plastic Surgery

## 2019-12-19 IMAGING — MR MR HEAD W/O CM
10 series · 48 of 48 positions shown · non-contrast
Comparison: None.

CLINICAL DATA: Transient memory loss.  Confusion.

EXAM:
MRI HEAD WITHOUT CONTRAST
TECHNIQUE: Multiplanar, multiecho pulse sequences of the brain and surrounding
structures were obtained without intravenous contrast.

[Series 2: T1 · sagittal · 5.0mm · 0.45mm/px · 3 of 25 slices shown (1 of 2)]
[im 1/25]
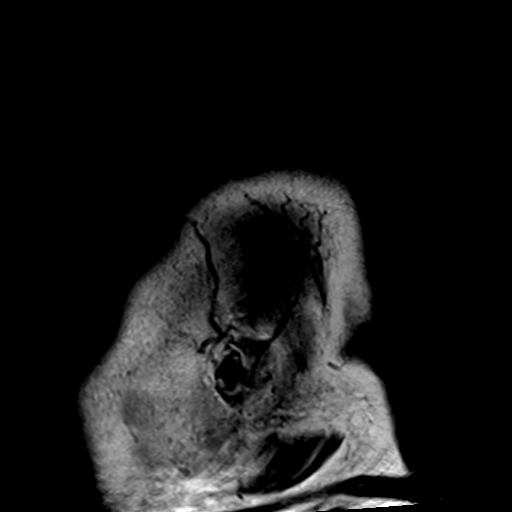
[im 13/25]
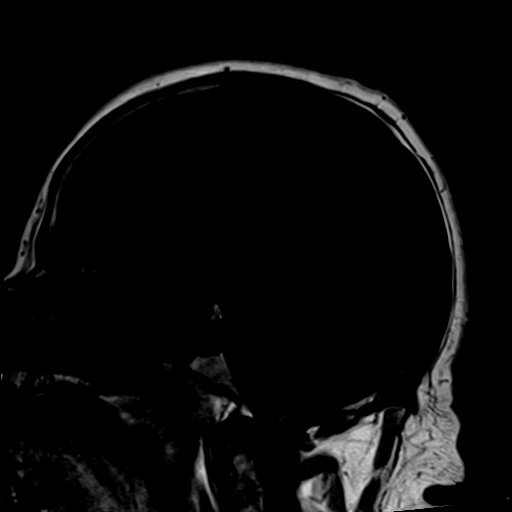
[im 25/25]
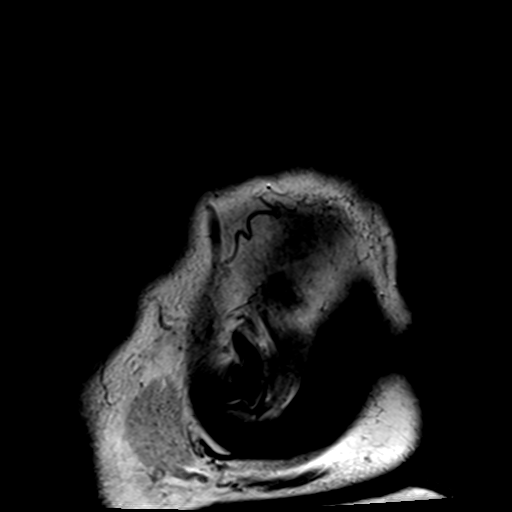

[Series 4: DWI · axial · 3.0mm · 1.20mm/px · z∈[-95,+74]mm · 6 of 58 slices shown (1 of 4)]
[im 1/58]
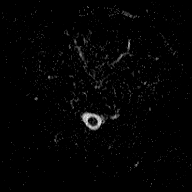
[im 12/58]
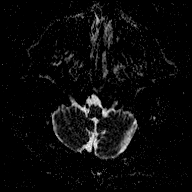
[im 23/58]
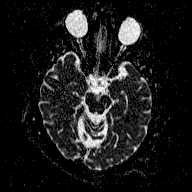
[im 35/58]
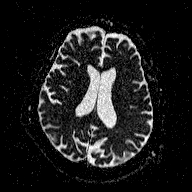
[im 46/58]
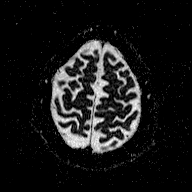
[im 58/58]
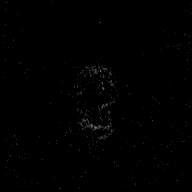

[Series 6: DWI · coronal · 3.0mm · 1.15mm/px · 4 of 48 slices shown (2 of 4)]
[im 1/48]
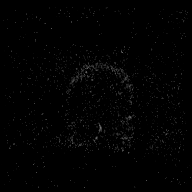
[im 16/48]
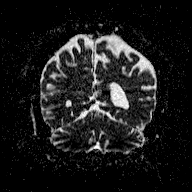
[im 32/48]
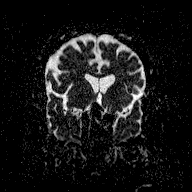
[im 48/48]
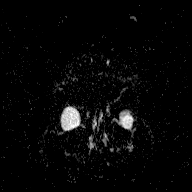

[Series 7: T2 · axial · 5.0mm · 0.72mm/px · z∈[-96,+78]mm · 2 of 26 slices shown (1 of 3)]
[im 1/26]
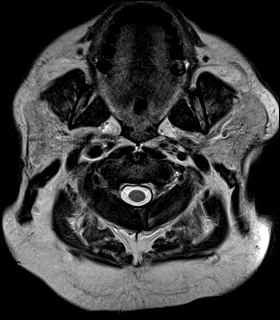
[im 26/26]
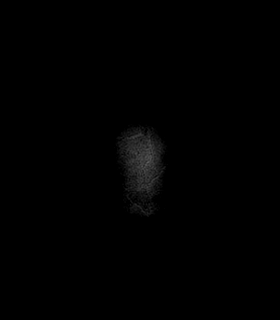

[Series 8: FLAIR · axial · 3.0mm · 0.45mm/px · z∈[-90,+71]mm · 5 of 55 slices shown]
[im 1/55]
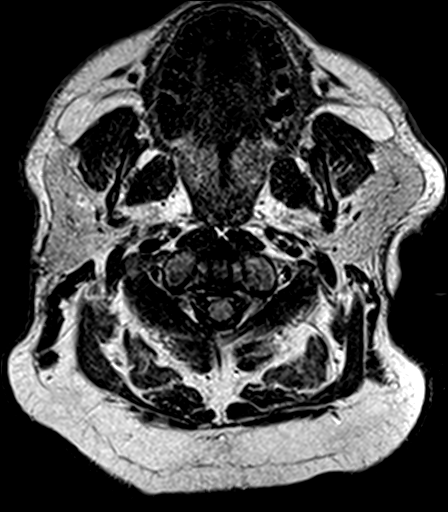
[im 14/55]
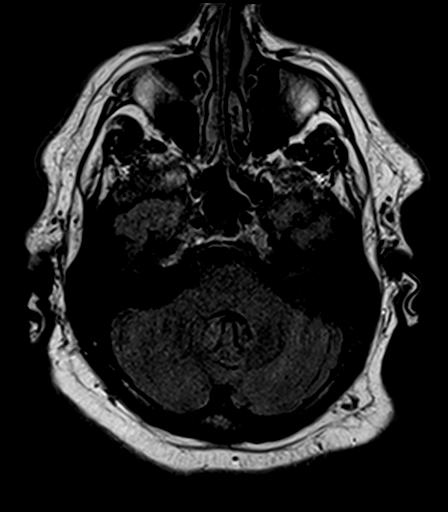
[im 28/55]
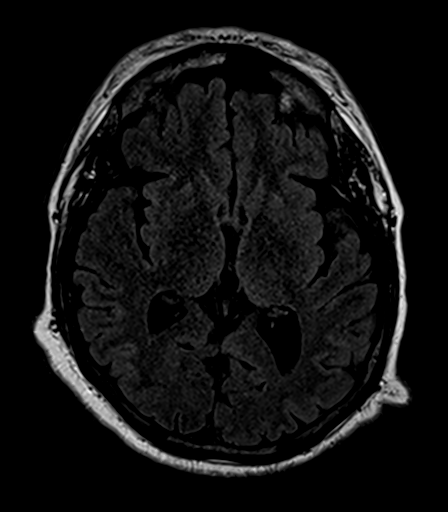
[im 41/55]
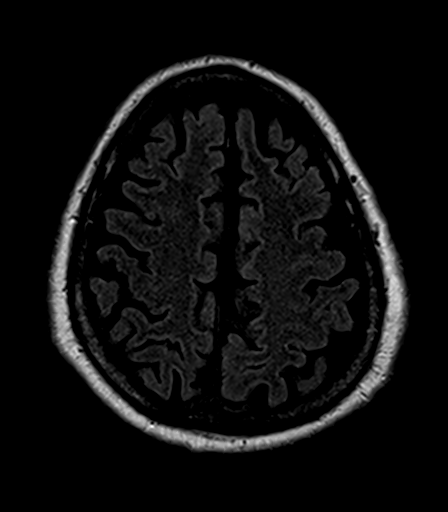
[im 55/55]
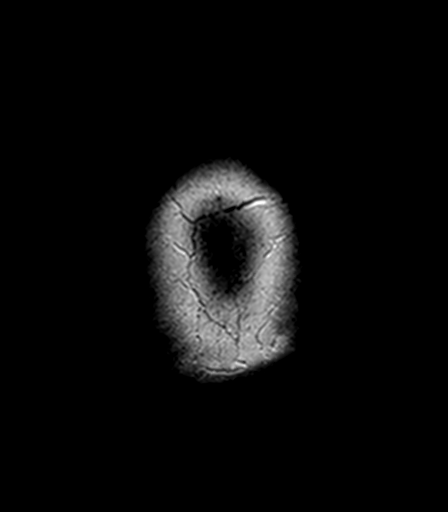

[Series 9: T2 · axial · 5.0mm · 0.72mm/px · z∈[-96,+78]mm · 2 of 26 slices shown (2 of 3)]
[im 1/26]
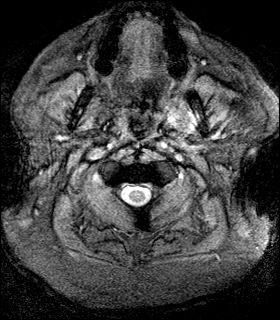
[im 26/26]
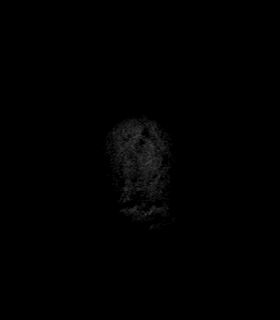

[Series 10: T1 · axial · 1.0mm · 1.00mm/px · z∈[-87,+71]mm · 14 of 160 slices shown (2 of 2)]
[im 1/160]
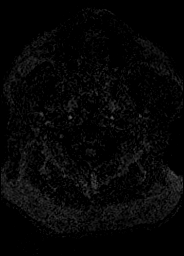
[im 13/160]
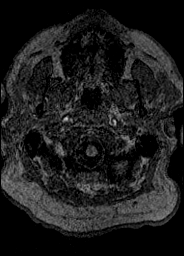
[im 25/160]
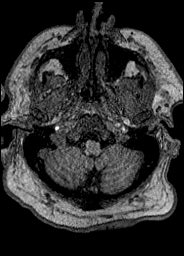
[im 37/160]
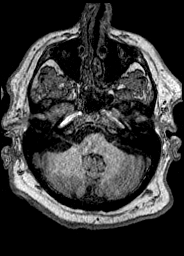
[im 49/160]
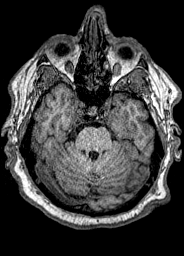
[im 62/160]
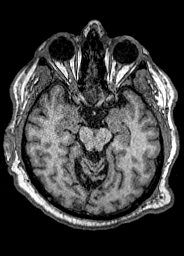
[im 74/160]
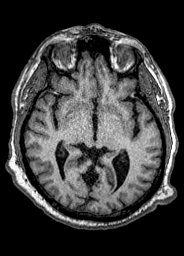
[im 86/160]
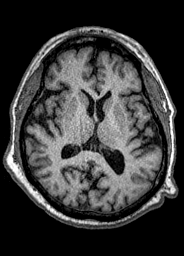
[im 98/160]
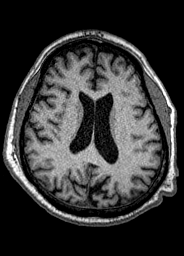
[im 111/160]
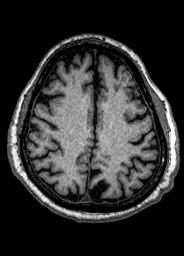
[im 123/160]
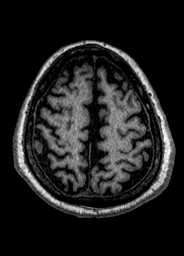
[im 135/160]
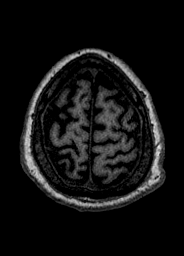
[im 147/160]
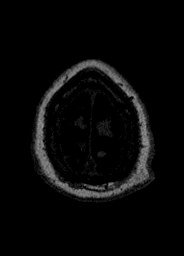
[im 160/160]
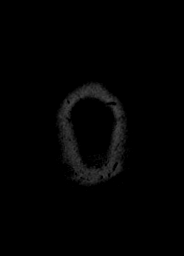

[Series 11: T2 · coronal · 5.0mm · 0.43mm/px · 3 of 31 slices shown (3 of 3)]
[im 1/31]
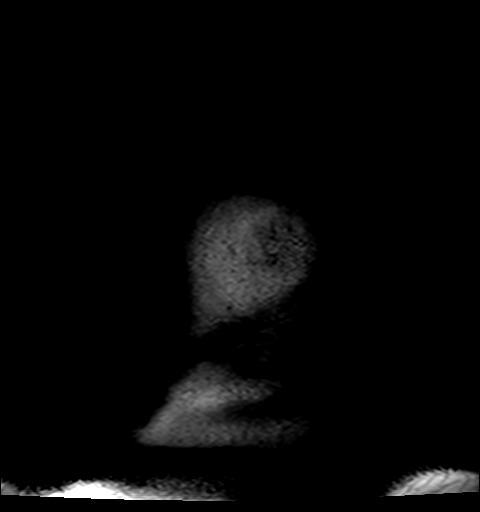
[im 16/31]
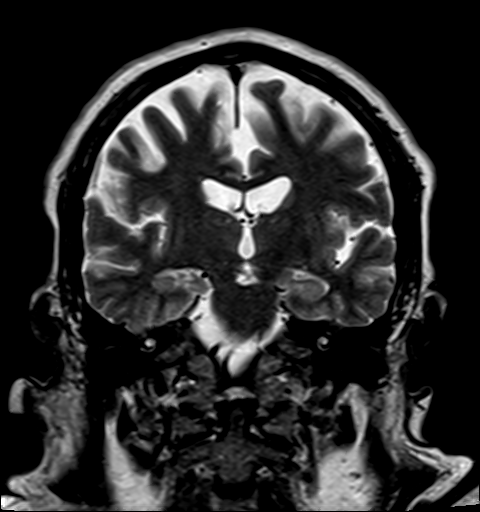
[im 31/31]
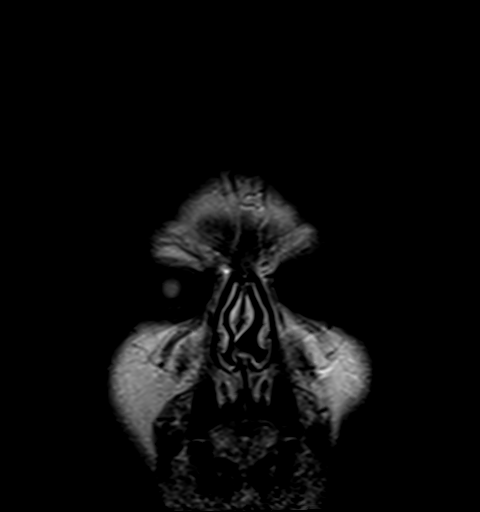

[Series 100: DWI · axial · 3.0mm · 1.20mm/px · z∈[-95,+74]mm · 5 of 58 slices shown (3 of 4)]
[im 1/58]
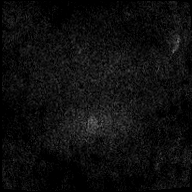
[im 15/58]
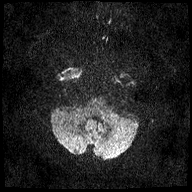
[im 29/58]
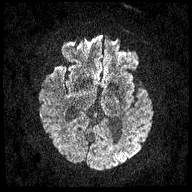
[im 43/58]
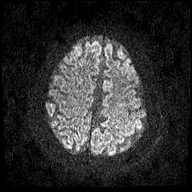
[im 58/58]
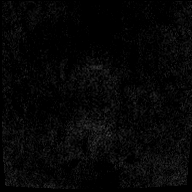

[Series 101: DWI · coronal · 3.0mm · 1.15mm/px · 4 of 48 slices shown (4 of 4)]
[im 1/48]
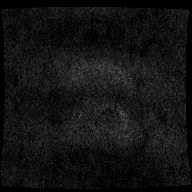
[im 16/48]
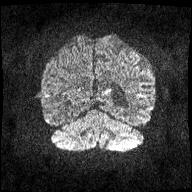
[im 32/48]
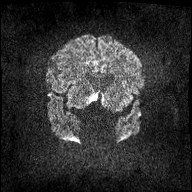
[im 48/48]
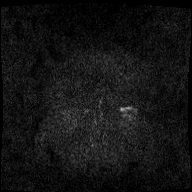

[48 of 48 positions shown; findings below may reference images not displayed]

FINDINGS: Brain: There is no evidence of acute infarct, intracranial
hemorrhage, mass, midline shift, or extra-axial fluid collection.
Cerebral atrophy appears mildly advanced for age without lobar
predominance. The brain is normal in signal without evidence of
significant white matter disease.

Vascular: Major intracranial vascular flow voids are preserved.

Skull and upper cervical spine: Unremarkable bone marrow signal.

Sinuses/Orbits: Bilateral cataract extraction. Scattered mild
mucosal thickening in the paranasal sinuses. Trace bilateral mastoid
fluid.

Other: None.
IMPRESSION: 1. No acute intracranial abnormality.
2. Mildly age advanced cerebral atrophy.

## 2019-12-23 DIAGNOSIS — R809 Proteinuria, unspecified: Secondary | ICD-10-CM | POA: Diagnosis not present

## 2019-12-23 DIAGNOSIS — I10 Essential (primary) hypertension: Secondary | ICD-10-CM | POA: Diagnosis not present

## 2019-12-23 DIAGNOSIS — E119 Type 2 diabetes mellitus without complications: Secondary | ICD-10-CM | POA: Diagnosis not present

## 2019-12-23 LAB — HEMOGLOBIN A1C: Hemoglobin A1C: 6.8

## 2020-01-08 DIAGNOSIS — Z008 Encounter for other general examination: Secondary | ICD-10-CM | POA: Diagnosis not present

## 2020-01-08 DIAGNOSIS — E11620 Type 2 diabetes mellitus with diabetic dermatitis: Secondary | ICD-10-CM | POA: Diagnosis not present

## 2020-01-08 DIAGNOSIS — I10 Essential (primary) hypertension: Secondary | ICD-10-CM | POA: Diagnosis not present

## 2020-01-08 DIAGNOSIS — D6869 Other thrombophilia: Secondary | ICD-10-CM | POA: Diagnosis not present

## 2020-01-08 DIAGNOSIS — N400 Enlarged prostate without lower urinary tract symptoms: Secondary | ICD-10-CM | POA: Diagnosis not present

## 2020-01-08 DIAGNOSIS — I4891 Unspecified atrial fibrillation: Secondary | ICD-10-CM | POA: Diagnosis not present

## 2020-01-08 DIAGNOSIS — M109 Gout, unspecified: Secondary | ICD-10-CM | POA: Diagnosis not present

## 2020-01-08 DIAGNOSIS — J449 Chronic obstructive pulmonary disease, unspecified: Secondary | ICD-10-CM | POA: Diagnosis not present

## 2020-01-08 DIAGNOSIS — N529 Male erectile dysfunction, unspecified: Secondary | ICD-10-CM | POA: Diagnosis not present

## 2020-01-08 DIAGNOSIS — E785 Hyperlipidemia, unspecified: Secondary | ICD-10-CM | POA: Diagnosis not present

## 2020-01-10 DIAGNOSIS — E119 Type 2 diabetes mellitus without complications: Secondary | ICD-10-CM | POA: Diagnosis not present

## 2020-01-10 DIAGNOSIS — R635 Abnormal weight gain: Secondary | ICD-10-CM | POA: Diagnosis not present

## 2020-01-10 DIAGNOSIS — I10 Essential (primary) hypertension: Secondary | ICD-10-CM | POA: Diagnosis not present

## 2020-01-16 DIAGNOSIS — E119 Type 2 diabetes mellitus without complications: Secondary | ICD-10-CM | POA: Diagnosis not present

## 2020-01-16 DIAGNOSIS — E785 Hyperlipidemia, unspecified: Secondary | ICD-10-CM | POA: Diagnosis not present

## 2020-01-16 DIAGNOSIS — G4733 Obstructive sleep apnea (adult) (pediatric): Secondary | ICD-10-CM | POA: Diagnosis not present

## 2020-01-16 DIAGNOSIS — N400 Enlarged prostate without lower urinary tract symptoms: Secondary | ICD-10-CM | POA: Diagnosis not present

## 2020-01-16 DIAGNOSIS — R69 Illness, unspecified: Secondary | ICD-10-CM | POA: Diagnosis not present

## 2020-01-16 DIAGNOSIS — I10 Essential (primary) hypertension: Secondary | ICD-10-CM | POA: Diagnosis not present

## 2020-02-19 DIAGNOSIS — E113393 Type 2 diabetes mellitus with moderate nonproliferative diabetic retinopathy without macular edema, bilateral: Secondary | ICD-10-CM | POA: Diagnosis not present

## 2020-02-23 ENCOUNTER — Encounter: Payer: Self-pay | Admitting: Family Medicine

## 2020-03-06 DIAGNOSIS — Z20822 Contact with and (suspected) exposure to covid-19: Secondary | ICD-10-CM | POA: Diagnosis not present

## 2020-03-08 ENCOUNTER — Other Ambulatory Visit: Payer: Self-pay | Admitting: Family Medicine

## 2020-03-08 NOTE — Telephone Encounter (Signed)
Requested Prescriptions  Pending Prescriptions Disp Refills  . allopurinol (ZYLOPRIM) 100 MG tablet [Pharmacy Med Name: Allopurinol 100mg  Tablet] 30 tablet 0    Sig: Take 1 tablet by mouth daily.     Endocrinology:  Gout Agents Failed - 03/08/2020  6:51 AM      Failed - Uric Acid in normal range and within 360 days    No results found for: POCURA, LABURIC       Failed - Cr in normal range and within 360 days    Creatinine  Date Value Ref Range Status  09/09/2014 1.09 0.60 - 1.30 mg/dL Final   Creatinine, Ser  Date Value Ref Range Status  11/21/2018 0.90 0.61 - 1.24 mg/dL Final         Passed - Valid encounter within last 12 months    Recent Outpatient Visits          6 months ago [place], Donald E, MD   1 year ago Encounter to discuss test results   [facility] Birdie Sons, MD   1 year ago Transient memory loss   [facility] Birdie Sons, MD   1 year ago Annual physical exam   [facility] Birdie Sons, MD   2 years ago Urinary frequency   [facility] Birdie Sons, MD

## 2020-03-24 ENCOUNTER — Encounter: Payer: Self-pay | Admitting: Family Medicine

## 2020-04-06 ENCOUNTER — Other Ambulatory Visit: Payer: Self-pay | Admitting: Family Medicine

## 2020-04-06 NOTE — Telephone Encounter (Signed)
Pt called in to ask if provider could resend Rx for allopurinol (ZYLOPRIM) 100 MG tablet to pill pack. Pt says that he was told by pill pack that they were unable to fill due to Rx being sent to a different pharmacy.   Pt would like further assistance.

## 2020-04-06 NOTE — Telephone Encounter (Signed)
Requested medication (s) are due for refill today: yes  Requested medication (s) are on the active medication list: yes  Last refill: 04/05/2020  Future visit scheduled: no  Notes to clinic: spoke with patient this morning and he states he will contact office for a visit Patient was given a courtesy refill already Review   Requested Prescriptions  Pending Prescriptions Disp Refills   allopurinol (ZYLOPRIM) 100 MG tablet [Pharmacy Med Name: Allopurinol 100mg  Tablet] 30 tablet 0    Sig: Take 1 tablet by mouth daily. **Please schedule an appointment for additional refills.**      Endocrinology:  Gout Agents Failed - 04/06/2020  5:24 AM      Failed - Uric Acid in normal range and within 360 days    No results found for: POCURA, LABURIC        Failed - Cr in normal range and within 360 days    Creatinine  Date Value Ref Range Status  09/09/2014 1.09 0.60 - 1.30 mg/dL Final   Creatinine, Ser  Date Value Ref Range Status  11/21/2018 0.90 0.61 - 1.24 mg/dL Final          Passed - Valid encounter within last 12 months    Recent Outpatient Visits           7 months ago [place], Donald E, MD   1 year ago Encounter to discuss test results   [facility] Birdie Sons, MD   1 year ago Transient memory loss   [facility] Birdie Sons, MD   1 year ago Annual physical exam   [facility] Birdie Sons, MD   2 years ago Urinary frequency   [facility] Birdie Sons, MD

## 2020-04-06 NOTE — Progress Notes (Signed)
I,Roshena L Chambers,acting as a scribe for Lelon Huh, MD.,have documented all relevant documentation on the behalf of Lelon Huh, MD,as directed by  Lelon Huh, MD while in the presence of Lelon Huh, MD.  Established patient visit    Patient: Jeremy Hendrix   DOB: [id number]   64 y.o. Male  MRN: [id number] Visit Date: 04/07/2020  Today's healthcare provider: Lelon Huh, MD   Chief Complaint  Patient presents with  . Foot Pain   Subjective    HPI Follow up for Gout:  The patient was last seen for this more than 1 year ago. He reports good compliance with treatment. He feels that condition is Worse. Patient believes he has a flare up of gout in the big toe of his right foot. He complains or toe pain and itching. He has been using Allopurinol, but needs a refill.  He is not having side effects. He states allopurinol has been working well and not had and significant flares ups as long as he takes it consistently.   He also requests refill for ketoconazole today which works well  He continues to follow up with Dr. Nilda Simmer for diabetes with last a1c in January of 6.8%       Medications: Outpatient Medications Prior to Visit  Medication Sig  . albuterol (VENTOLIN HFA) 108 (90 Base) MCG/ACT inhaler Inhale into the lungs.  Marland Kitchen allopurinol (ZYLOPRIM) 100 MG tablet Take 1 tablet by mouth daily.  Marland Kitchen aspirin 325 MG tablet Take 325 mg by mouth daily.  Marland Kitchen atorvastatin (LIPITOR) 20 MG tablet Take 20 mg by mouth daily.  Marland Kitchen b complex vitamins capsule Take 1 capsule by mouth daily.  . Cholecalciferol (VITAMIN D) 2000 UNITS CAPS Take by mouth.  . fluticasone (FLONASE) 50 MCG/ACT nasal spray Place 2 sprays into both nostrils daily.  . Fluticasone-Salmeterol (ADVAIR) 250-50 MCG/DOSE AEPB Inhale 1 puff into the lungs 2 (two) times daily.  Marland Kitchen glucose blood (ONE TOUCH ULTRA TEST) test strip Use as instructed to check daily for type 2 diabetes e11.9  . HYDROcodone-acetaminophen  (NORCO) 5-325 MG tablet Take 1 tablet by mouth every 6 (six) hours as needed for moderate pain.  . INCRUSE ELLIPTA 62.5 MCG/INH AEPB   . LECITHIN PO Take by mouth.  Marland Kitchen lisinopril-hydrochlorothiazide (ZESTORETIC) 20-12.5 MG tablet   . MULTIPLE VITAMINS-MINERALS PO Take by mouth.  . Omega-3 Fatty Acids (FISH OIL) 1000 MG CAPS Take by mouth.  . phentermine (ADIPEX-P) 37.5 MG tablet TAKE 1 2 (ONE HALF) TABLET BY MOUTH DAILY FOR 3 DAYS THEN TAKE 1 TABLET DAILY BEFORE LUNCH.  . tamsulosin (FLOMAX) 0.4 MG CAPS capsule Take 1 capsule by mouth daily.  Marland Kitchen TAZTIA XT 240 MG 24 hr capsule   . vitamin E 400 UNIT capsule Take 400 Units by mouth daily.  . ciclopirox (LOPROX) 0.77 % cream Apply topically 2 (two) times daily. (Patient not taking: Reported on 04/07/2020)  . ketoconazole (NIZORAL) 2 % cream Apply 1 application topically daily. (Patient not taking: Reported on 08/30/2019)  . [DISCONTINUED] mometasone (ELOCON) 0.1 % cream Apply 1 application topically daily. Apply to ear rash when occurs (Patient not taking: Reported on 08/30/2019)  . [DISCONTINUED] Testosterone Enanthate (XYOSTED) 50 MG/0.5ML SOAJ Inject 50 mg into the skin every 7 (seven) days. (Patient not taking: Reported on 08/30/2019)   No facility-administered medications prior to visit.    Review of Systems  Constitutional: Negative for appetite change, chills and fever.  Respiratory: Negative for chest tightness, shortness of  breath and wheezing.   Cardiovascular: Negative for chest pain and palpitations.  Gastrointestinal: Negative for abdominal pain, nausea and vomiting.  Musculoskeletal: Positive for arthralgias (pain in great toe of right foot).       Objective    BP (!) 144/86 (BP Location: Left Arm, Patient Position: Sitting, Cuff Size: Large)   Pulse 95   Temp 98.2 F (36.8 C) (Temporal)   Resp 20   Wt (!) 380 lb (172.4 kg)   SpO2 95% Comment: room air  BMI 59.52 kg/m    Physical Exam   General: Appearance:    Severely  obese male in no acute distress  Eyes:    PERRL, conjunctiva/corneas clear, EOM's intact       Lungs:     Clear to auscultation bilaterally, respirations unlabored  Heart:    Normal heart rate. Normal rhythm. No murmurs, rubs, or gallops.   MS:   All extremities are intact.   Neurologic:   Awake, alert, oriented x 3. No apparent focal neurological           defect.         No results found for any visits on 04/07/20.   Assessment & Plan    1. Diabetes mellitus without complication ([place]) Well controlled, managed by Dr. Nilda Simmer . He does need refill for test strips.  - glucose blood (ONE TOUCH ULTRA TEST) test strip; Use as instructed to check daily for type 2 diabetes e11.9  Dispense: 100 each; Refill: 4  2. Morbid obesity ([place]) Is on very low carb diet which he reports he is doing well with.    3. Essential hypertension Stable Continue current medications.  He states his cardiologist has been checking cholesterol.   4. Tinea cruris refill- ketoconazole (NIZORAL) 2 % cream; Apply 1 application topically daily.  Dispense: 90 g; Refill: 2  5. Moderate COPD (chronic obstructive pulmonary disease) ([facility]) Doing well with current inhalers, managed by Dr. Raul Del  6. Prostate cancer screening  - PSA  7. Chronic gout involving toe without tophus, unspecified cause, unspecified laterality refill allopurinol (ZYLOPRIM) 100 MG tablet; Take 1 tablet (100 mg total) by mouth daily.  Dispense: 90 tablet; Refill: 3       The entirety of the information documented in the History of Present Illness, Review of Systems and Physical Exam were personally obtained by me. Portions of this information were initially documented by the CMA and reviewed by me for thoroughness and accuracy.      Lelon Huh, MD  [facility] [phone] (phone) [phone] (fax)  [place]

## 2020-04-07 ENCOUNTER — Encounter: Payer: Self-pay | Admitting: Family Medicine

## 2020-04-07 ENCOUNTER — Other Ambulatory Visit: Payer: Self-pay

## 2020-04-07 ENCOUNTER — Ambulatory Visit (INDEPENDENT_AMBULATORY_CARE_PROVIDER_SITE_OTHER): Payer: Medicare HMO | Admitting: Family Medicine

## 2020-04-07 VITALS — BP 144/86 | HR 95 | Temp 98.2°F | Resp 20 | Wt 380.0 lb

## 2020-04-07 DIAGNOSIS — B356 Tinea cruris: Secondary | ICD-10-CM

## 2020-04-07 DIAGNOSIS — I10 Essential (primary) hypertension: Secondary | ICD-10-CM

## 2020-04-07 DIAGNOSIS — Z125 Encounter for screening for malignant neoplasm of prostate: Secondary | ICD-10-CM

## 2020-04-07 DIAGNOSIS — J449 Chronic obstructive pulmonary disease, unspecified: Secondary | ICD-10-CM

## 2020-04-07 DIAGNOSIS — R69 Illness, unspecified: Secondary | ICD-10-CM | POA: Diagnosis not present

## 2020-04-07 DIAGNOSIS — E119 Type 2 diabetes mellitus without complications: Secondary | ICD-10-CM | POA: Diagnosis not present

## 2020-04-07 DIAGNOSIS — M1A9XX0 Chronic gout, unspecified, without tophus (tophi): Secondary | ICD-10-CM | POA: Diagnosis not present

## 2020-04-07 MED ORDER — KETOCONAZOLE 2 % EX CREA: 1.0000 "application " | TOPICAL_CREAM | Freq: Every day | CUTANEOUS | 2 refills | Status: AC

## 2020-04-07 MED ORDER — GLUCOSE BLOOD VI STRP: ORAL_STRIP | 4 refills | Status: AC

## 2020-04-07 MED ORDER — ALLOPURINOL 100 MG PO TABS: 100.0000 mg | ORAL_TABLET | Freq: Every day | ORAL | 3 refills | Status: DC

## 2020-04-07 NOTE — Telephone Encounter (Signed)
This was not filled yesterday.  Patient was given courtesy refill in March and was told to make appointment before it ran out.  He told [facility] at that time he would call and get ov.

## 2020-04-07 NOTE — Telephone Encounter (Signed)
Office visit scheduled for today. Will address refill request at that time.

## 2020-04-08 ENCOUNTER — Encounter: Payer: Self-pay | Admitting: Family Medicine

## 2020-04-20 ENCOUNTER — Encounter: Payer: Self-pay | Admitting: Family Medicine

## 2020-04-20 NOTE — Progress Notes (Signed)
Established patient visit   Patient: Jeremy Hendrix   DOB: [id number]   64 y.o. Male  MRN: [id number] Visit Date: 04/21/2020  Today's healthcare provider: Lelon Huh, MD   Chief Complaint  Patient presents with  . Tinea Cruris   I,Latasha Walston,acting as a scribe for Lelon Huh, MD.,have documented all relevant documentation on the behalf of Lelon Huh, MD,as directed by  Lelon Huh, MD while in the presence of Lelon Huh, MD.  Subjective    HPI Follow up for Tinea Corpora:  The patient was last seen for this 2 weeks ago. Changes made at last visit include refilled Nizoral 2% cream at which time he had irritating rash on right lower leg. He had same rash in September which initially cleared with ketoconazole, but returned as soon as ketoconazole was finished. He was then prescribed ciclopirox which worked well and cleared up area for a few months before return and prescribed ketoconazole at visit 2 weeks ago for Tinea Cruris. The tinea Cruris cleared up so he tried He reports good compliance with treatment. He feels that condition is improved while taking the medication, but after finishing the medication the area became worse again. He is not having side effects.   -----------------------------------------------------------------------------------------    Medications: Outpatient Medications Prior to Visit  Medication Sig  . albuterol (VENTOLIN HFA) 108 (90 Base) MCG/ACT inhaler Inhale into the lungs.  Marland Kitchen allopurinol (ZYLOPRIM) 100 MG tablet Take 1 tablet (100 mg total) by mouth daily.  Marland Kitchen aspirin 325 MG tablet Take 325 mg by mouth daily.  Marland Kitchen atorvastatin (LIPITOR) 20 MG tablet Take 20 mg by mouth daily.  Marland Kitchen b complex vitamins capsule Take 1 capsule by mouth daily.  . Cholecalciferol (VITAMIN D) 2000 UNITS CAPS Take by mouth.  . fluticasone (FLONASE) 50 MCG/ACT nasal spray Place 2 sprays into both nostrils daily.  . Fluticasone-Salmeterol (ADVAIR) 250-50  MCG/DOSE AEPB Inhale 1 puff into the lungs 2 (two) times daily.  Marland Kitchen glucose blood (ONE TOUCH ULTRA TEST) test strip Use as instructed to check daily for type 2 diabetes e11.9  . HYDROcodone-acetaminophen (NORCO) 5-325 MG tablet Take 1 tablet by mouth every 6 (six) hours as needed for moderate pain.  . INCRUSE ELLIPTA 62.5 MCG/INH AEPB   . ketoconazole (NIZORAL) 2 % cream Apply 1 application topically daily.  Marland Kitchen LECITHIN PO Take by mouth.  Marland Kitchen lisinopril-hydrochlorothiazide (ZESTORETIC) 20-12.5 MG tablet   . MULTIPLE VITAMINS-MINERALS PO Take by mouth.  . Omega-3 Fatty Acids (FISH OIL) 1000 MG CAPS Take by mouth.  . phentermine (ADIPEX-P) 37.5 MG tablet TAKE 1 2 (ONE HALF) TABLET BY MOUTH DAILY FOR 3 DAYS THEN TAKE 1 TABLET DAILY BEFORE LUNCH.  . tamsulosin (FLOMAX) 0.4 MG CAPS capsule Take 1 capsule by mouth daily.  Marland Kitchen TAZTIA XT 240 MG 24 hr capsule   . vitamin E 400 UNIT capsule Take 400 Units by mouth daily.  . ciclopirox (LOPROX) 0.77 % cream Apply topically 2 (two) times daily. (Patient not taking: Reported on 04/07/2020)   No facility-administered medications prior to visit.    Review of Systems  Constitutional: Negative.   Respiratory: Negative.   Cardiovascular: Negative.   Musculoskeletal: Negative.   Skin: Positive for rash.      Objective    BP 130/75 (BP Location: Right Arm, Patient Position: Sitting, Cuff Size: Large)   Pulse 95   Temp (!) 96.9 F (36.1 C) (Temporal)   Wt (!) 376 lb (170.6 kg)   BMI 58.89  kg/m    Physical Exam   Right lower leg slightly red and flaky circular lesion similar to exam of 08/30/2019  No results found for any visits on 04/21/20.  Assessment & Plan     1. Tinea corporis Did well when last prescribed- ciclopirox (LOPROX) 0.77 % cream; Apply topically 2 (two) times daily.  Dispense: 90 g; Refill: 2, which is refilled today.   No follow-ups on file.      The entirety of the information documented in the History of Present Illness,  Review of Systems and Physical Exam were personally obtained by me. Portions of this information were initially documented by the CMA and reviewed by me for thoroughness and accuracy.      Lelon Huh, MD  [facility] [phone] (phone) [phone] (fax)  [place]

## 2020-04-21 ENCOUNTER — Other Ambulatory Visit: Payer: Self-pay

## 2020-04-21 ENCOUNTER — Encounter: Payer: Self-pay | Admitting: Family Medicine

## 2020-04-21 ENCOUNTER — Ambulatory Visit (INDEPENDENT_AMBULATORY_CARE_PROVIDER_SITE_OTHER): Payer: Medicare HMO | Admitting: Family Medicine

## 2020-04-21 VITALS — BP 130/75 | HR 95 | Temp 96.9°F | Wt 376.0 lb

## 2020-04-21 DIAGNOSIS — B354 Tinea corporis: Secondary | ICD-10-CM

## 2020-04-21 MED ORDER — CICLOPIROX OLAMINE 0.77 % EX CREA: TOPICAL_CREAM | Freq: Two times a day (BID) | CUTANEOUS | 2 refills | Status: AC

## 2020-05-05 DIAGNOSIS — G4733 Obstructive sleep apnea (adult) (pediatric): Secondary | ICD-10-CM | POA: Diagnosis not present

## 2020-05-05 DIAGNOSIS — E119 Type 2 diabetes mellitus without complications: Secondary | ICD-10-CM | POA: Diagnosis not present

## 2020-05-05 DIAGNOSIS — I10 Essential (primary) hypertension: Secondary | ICD-10-CM | POA: Diagnosis not present

## 2020-05-05 DIAGNOSIS — N400 Enlarged prostate without lower urinary tract symptoms: Secondary | ICD-10-CM | POA: Diagnosis not present

## 2020-05-05 DIAGNOSIS — J449 Chronic obstructive pulmonary disease, unspecified: Secondary | ICD-10-CM | POA: Diagnosis not present

## 2020-05-05 DIAGNOSIS — R69 Illness, unspecified: Secondary | ICD-10-CM | POA: Diagnosis not present

## 2020-05-05 DIAGNOSIS — E785 Hyperlipidemia, unspecified: Secondary | ICD-10-CM | POA: Diagnosis not present

## 2020-05-12 ENCOUNTER — Other Ambulatory Visit: Payer: Self-pay | Admitting: Family Medicine

## 2020-05-12 MED ORDER — LISINOPRIL-HYDROCHLOROTHIAZIDE 20-12.5 MG PO TABS: 1.0000 | ORAL_TABLET | Freq: Every day | ORAL | 2 refills | Status: AC

## 2020-05-12 MED ORDER — ALBUTEROL SULFATE HFA 108 (90 BASE) MCG/ACT IN AERS: 2.0000 | INHALATION_SPRAY | Freq: Four times a day (QID) | RESPIRATORY_TRACT | 2 refills | Status: AC | PRN

## 2020-05-12 MED ORDER — ATORVASTATIN CALCIUM 20 MG PO TABS: 20.0000 mg | ORAL_TABLET | Freq: Every day | ORAL | 2 refills | Status: AC

## 2020-05-12 MED ORDER — TAMSULOSIN HCL 0.4 MG PO CAPS: 0.4000 mg | ORAL_CAPSULE | Freq: Every day | ORAL | 2 refills | Status: AC

## 2020-05-12 NOTE — Telephone Encounter (Signed)
[facility] faxed refill request for the following medications:  albuterol (VENTOLIN HFA) 108 (90 Base) MCG/ACT inhaler   tamsulosin (FLOMAX) 0.4 MG CAPS capsule  lisinopril-hydrochlorothiazide (ZESTORETIC) 20-12.5 MG tablet  atorvastatin (LIPITOR) 20 MG tablet    Please advise.

## 2020-05-12 NOTE — Telephone Encounter (Signed)
Please review. RX was from historical provider.

## 2020-05-13 ENCOUNTER — Other Ambulatory Visit: Payer: Self-pay | Admitting: Family Medicine

## 2020-05-13 DIAGNOSIS — J449 Chronic obstructive pulmonary disease, unspecified: Secondary | ICD-10-CM

## 2020-05-13 DIAGNOSIS — M1A9XX0 Chronic gout, unspecified, without tophus (tophi): Secondary | ICD-10-CM

## 2020-05-13 NOTE — Telephone Encounter (Signed)
[facility] faxed refill request for the following medications:  allopurinol (ZYLOPRIM) 100 MG tablet INCRUSE ELLIPTA 62.5 MCG/INH AEPB Fluticasone-Salmeterol (ADVAIR) 250-50 MCG/DOSE AEPB   Please advise.  Thanks, [employer]

## 2020-05-13 NOTE — Telephone Encounter (Signed)
During the last office visit, patient mentioned that he now uses pill pack with [facility]. I called patient to verify that he needs prescriptions sent to [place]. Patient confirmed that he needs these prescriptions sent to [facility] mail order pharmacy. He says starting on  05/19/2020 he will no longer be with Pill pack with [facility] due to him changing insurance (leaving [employer] to go with [facility]). Please review refill request. One of the medications (Incruse Ellipta) has never been prescribed by Dr. Caryn Section. It was entered historically in 2018 by GI. Please advise on qty and refills for that medication.

## 2020-05-14 ENCOUNTER — Other Ambulatory Visit: Payer: Self-pay | Admitting: Family Medicine

## 2020-05-14 MED ORDER — ALLOPURINOL 100 MG PO TABS: 100.0000 mg | ORAL_TABLET | Freq: Every day | ORAL | 3 refills | Status: AC

## 2020-05-14 MED ORDER — INCRUSE ELLIPTA 62.5 MCG/INH IN AEPB: 1.0000 | INHALATION_SPRAY | Freq: Every day | RESPIRATORY_TRACT | 3 refills | Status: DC

## 2020-05-14 MED ORDER — FLUTICASONE-SALMETEROL 250-50 MCG/DOSE IN AEPB: 1.0000 | INHALATION_SPRAY | Freq: Two times a day (BID) | RESPIRATORY_TRACT | 3 refills | Status: DC

## 2020-05-14 NOTE — Telephone Encounter (Signed)
Refill for Lisinopril/ HCTZ 20-12.5MG  Tablet sent into [facility] on 05/12/2020. I called pharmacy and verified that they received that prescription. Per pharmacy tech, this refill request was sent in error. Please discard.

## 2020-05-14 NOTE — Telephone Encounter (Signed)
[facility] faxed refill request for the following medications:  Lisinopril 10 mg - hydrochlorothiazide 12.05 mg tablet  Please advise.  Thanks, [employer]

## 2020-05-22 ENCOUNTER — Other Ambulatory Visit: Payer: Self-pay | Admitting: Family Medicine

## 2020-05-22 DIAGNOSIS — J449 Chronic obstructive pulmonary disease, unspecified: Secondary | ICD-10-CM

## 2020-05-22 MED ORDER — SPIRIVA RESPIMAT 2.5 MCG/ACT IN AERS
2.0000 | INHALATION_SPRAY | Freq: Every day | RESPIRATORY_TRACT | 4 refills | Status: AC
Start: 2020-05-22 — End: ?

## 2020-05-22 MED ORDER — BUDESONIDE-FORMOTEROL FUMARATE 160-4.5 MCG/ACT IN AERO: 2.0000 | INHALATION_SPRAY | Freq: Two times a day (BID) | RESPIRATORY_TRACT | 4 refills | Status: AC

## 2020-05-22 NOTE — Progress Notes (Signed)
Per fax from [place], Incruse and [place] (Advair) not on formulary, but Symbicort and Spiriva are on formulary and require no PA

## 2020-07-03 ENCOUNTER — Telehealth: Payer: Self-pay | Admitting: Family Medicine

## 2020-07-03 NOTE — Chronic Care Management (AMB) (Signed)
  Chronic Care Management   Note  07/03/2020 Name: Jeremy Hendrix MRN: [id number] DOB: [id number]  Jeremy Hendrix is a 64 y.o. year old male who is a primary care patient of Caryn Section, Kirstie Peri, MD. I reached out to Lysle Morales by phone today in response to a referral sent by Mr. Lelan Pons Tien's health plan.     Mr. Mitchum was given information about Chronic Care Management services today including:  1. CCM service includes personalized support from designated clinical staff supervised by his physician, including individualized plan of care and coordination with other care providers 2. 24/7 contact phone numbers for assistance for urgent and routine care needs. 3. Service will only be billed when office clinical staff spend 20 minutes or more in a month to coordinate care. 4. Only one practitioner may furnish and bill the service in a calendar month. 5. The patient may stop CCM services at any time (effective at the end of the month) by phone call to the office staff. 6. The patient will be responsible for cost sharing (co-pay) of up to 20% of the service fee (after annual deductible is met).  Patient did not agree to enrollment in care management services and does not wish to consider at this time.  Follow up plan: Patient no longer lives in [place].  PCP Birdie Sons, MD notified via routed documentation in medical record.   [place], [place] [address] Direct Dial: [phone] [email or web address] Website: [email or web address]

## 2020-12-28 ENCOUNTER — Telehealth: Payer: Self-pay | Admitting: Family Medicine

## 2020-12-28 NOTE — Telephone Encounter (Signed)
Copied from [facility] [phone]. Topic: Medicare AWV >> Dec 28, 2020 10:44 AM Cher Nakai R wrote: Reason for CRM:  Left message for patient to call back and schedule Medicare Annual Wellness Visit (AWV) in office.   If not able to come in office, please offer to do virtually.   Last AWV 07/04/2018  Please schedule at anytime with [facility] Health Advisor.  If any questions, please contact me at [phone]
